# Patient Record
Sex: Female | Born: 1970
Health system: Southern US, Community
[De-identification: ages and names within clinical notes are randomized; demographics above are authoritative.]

## PROBLEM LIST (undated history)

## (undated) DIAGNOSIS — F329 Major depressive disorder, single episode, unspecified: Secondary | ICD-10-CM

## (undated) DIAGNOSIS — K219 Gastro-esophageal reflux disease without esophagitis: Secondary | ICD-10-CM

## (undated) DIAGNOSIS — Z8619 Personal history of other infectious and parasitic diseases: Secondary | ICD-10-CM

## (undated) DIAGNOSIS — G43909 Migraine, unspecified, not intractable, without status migrainosus: Secondary | ICD-10-CM

## (undated) DIAGNOSIS — K602 Anal fissure, unspecified: Secondary | ICD-10-CM

## (undated) DIAGNOSIS — F419 Anxiety disorder, unspecified: Secondary | ICD-10-CM

## (undated) DIAGNOSIS — E039 Hypothyroidism, unspecified: Secondary | ICD-10-CM

## (undated) DIAGNOSIS — F32A Depression, unspecified: Secondary | ICD-10-CM

## (undated) DIAGNOSIS — N390 Urinary tract infection, site not specified: Secondary | ICD-10-CM

## (undated) DIAGNOSIS — F319 Bipolar disorder, unspecified: Secondary | ICD-10-CM

## (undated) HISTORY — DX: Anxiety disorder, unspecified: F41.9

## (undated) HISTORY — DX: Personal history of other infectious and parasitic diseases: Z86.19

## (undated) HISTORY — DX: Major depressive disorder, single episode, unspecified: F32.9

## (undated) HISTORY — DX: Anal fissure, unspecified: K60.2

## (undated) HISTORY — DX: Gastro-esophageal reflux disease without esophagitis: K21.9

## (undated) HISTORY — DX: Depression, unspecified: F32.A

## (undated) HISTORY — DX: Migraine, unspecified, not intractable, without status migrainosus: G43.909

## (undated) HISTORY — DX: Hypothyroidism, unspecified: E03.9

## (undated) HISTORY — DX: Urinary tract infection, site not specified: N39.0

## (undated) HISTORY — DX: Bipolar disorder, unspecified: F31.9

---

## 1998-07-27 ENCOUNTER — Other Ambulatory Visit: Admission: RE | Admit: 1998-07-27 | Discharge: 1998-07-27 | Payer: Self-pay | Admitting: Obstetrics and Gynecology

## 2003-12-30 ENCOUNTER — Other Ambulatory Visit: Payer: Self-pay

## 2004-03-19 ENCOUNTER — Emergency Department: Payer: Self-pay | Admitting: General Practice

## 2004-12-27 ENCOUNTER — Ambulatory Visit: Payer: Self-pay | Admitting: Internal Medicine

## 2005-02-16 ENCOUNTER — Ambulatory Visit: Payer: Self-pay | Admitting: Internal Medicine

## 2005-04-03 ENCOUNTER — Ambulatory Visit: Payer: Self-pay | Admitting: Internal Medicine

## 2005-04-07 ENCOUNTER — Emergency Department: Payer: Self-pay | Admitting: Emergency Medicine

## 2005-04-09 ENCOUNTER — Encounter (INDEPENDENT_AMBULATORY_CARE_PROVIDER_SITE_OTHER): Payer: Self-pay | Admitting: Specialist

## 2005-04-09 ENCOUNTER — Ambulatory Visit: Payer: Self-pay | Admitting: Gastroenterology

## 2005-05-06 HISTORY — PX: TUBAL LIGATION: SHX77

## 2005-05-21 ENCOUNTER — Ambulatory Visit: Payer: Self-pay | Admitting: Gastroenterology

## 2005-07-24 ENCOUNTER — Ambulatory Visit: Payer: Self-pay | Admitting: *Deleted

## 2006-03-14 ENCOUNTER — Ambulatory Visit: Payer: Self-pay | Admitting: Obstetrics and Gynecology

## 2007-02-26 ENCOUNTER — Emergency Department: Payer: Self-pay | Admitting: Emergency Medicine

## 2008-05-06 HISTORY — PX: CHOLECYSTECTOMY: SHX55

## 2008-05-06 LAB — HM COLONOSCOPY: HM Colonoscopy: NORMAL

## 2008-08-29 ENCOUNTER — Ambulatory Visit (HOSPITAL_COMMUNITY): Admission: RE | Admit: 2008-08-29 | Discharge: 2008-08-29 | Payer: Self-pay | Admitting: Gastroenterology

## 2008-10-12 ENCOUNTER — Ambulatory Visit: Payer: Self-pay | Admitting: General Surgery

## 2009-05-06 HISTORY — PX: OTHER SURGICAL HISTORY: SHX169

## 2009-10-06 ENCOUNTER — Ambulatory Visit: Payer: Self-pay | Admitting: Obstetrics and Gynecology

## 2010-06-11 IMAGING — MG MAM DGTL SCREENING MAMMO W/CAD
1 series · 4 of 4 positions shown · non-contrast
Comparison: none

REASON FOR EXAM: BASELINE
COMMENTS:

PROCEDURE:     MAM - MAM DGTL SCREENING MAMMO W/CAD  - October 06, 2009  [DATE]
RESULT:       This is a baseline study.
The breasts exhibit a symmetric moderately dense parenchymal pattern. There
is no dominant mass. There are benign-appearing lymph nodes in the axillary
regions.

[Series 522: R CC · right · 4 of 4 slices shown]
[im 1/4]
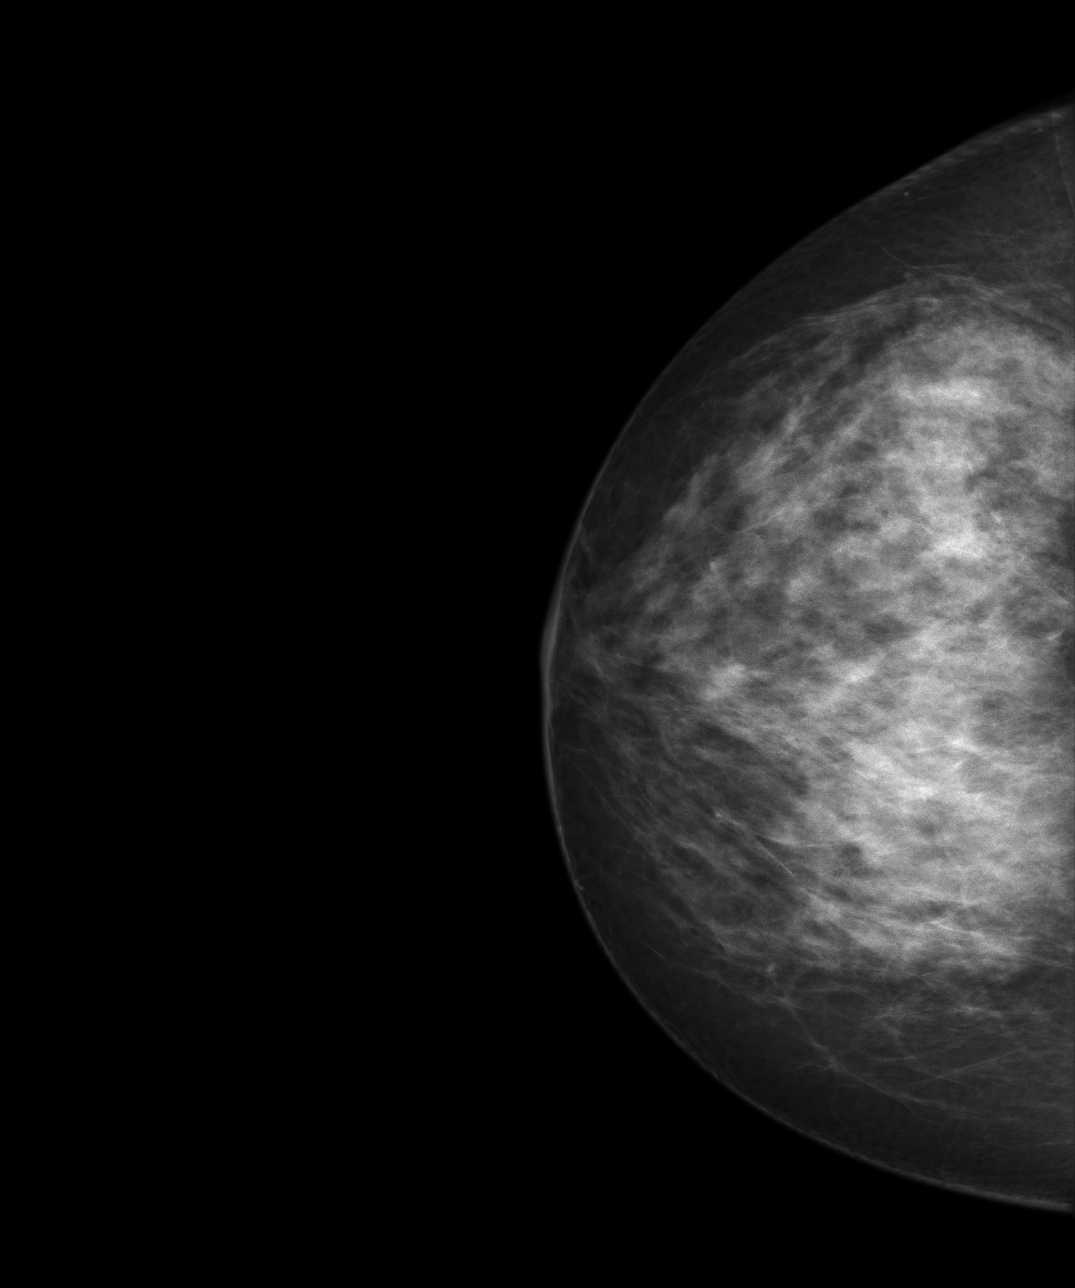
[im 2/4]
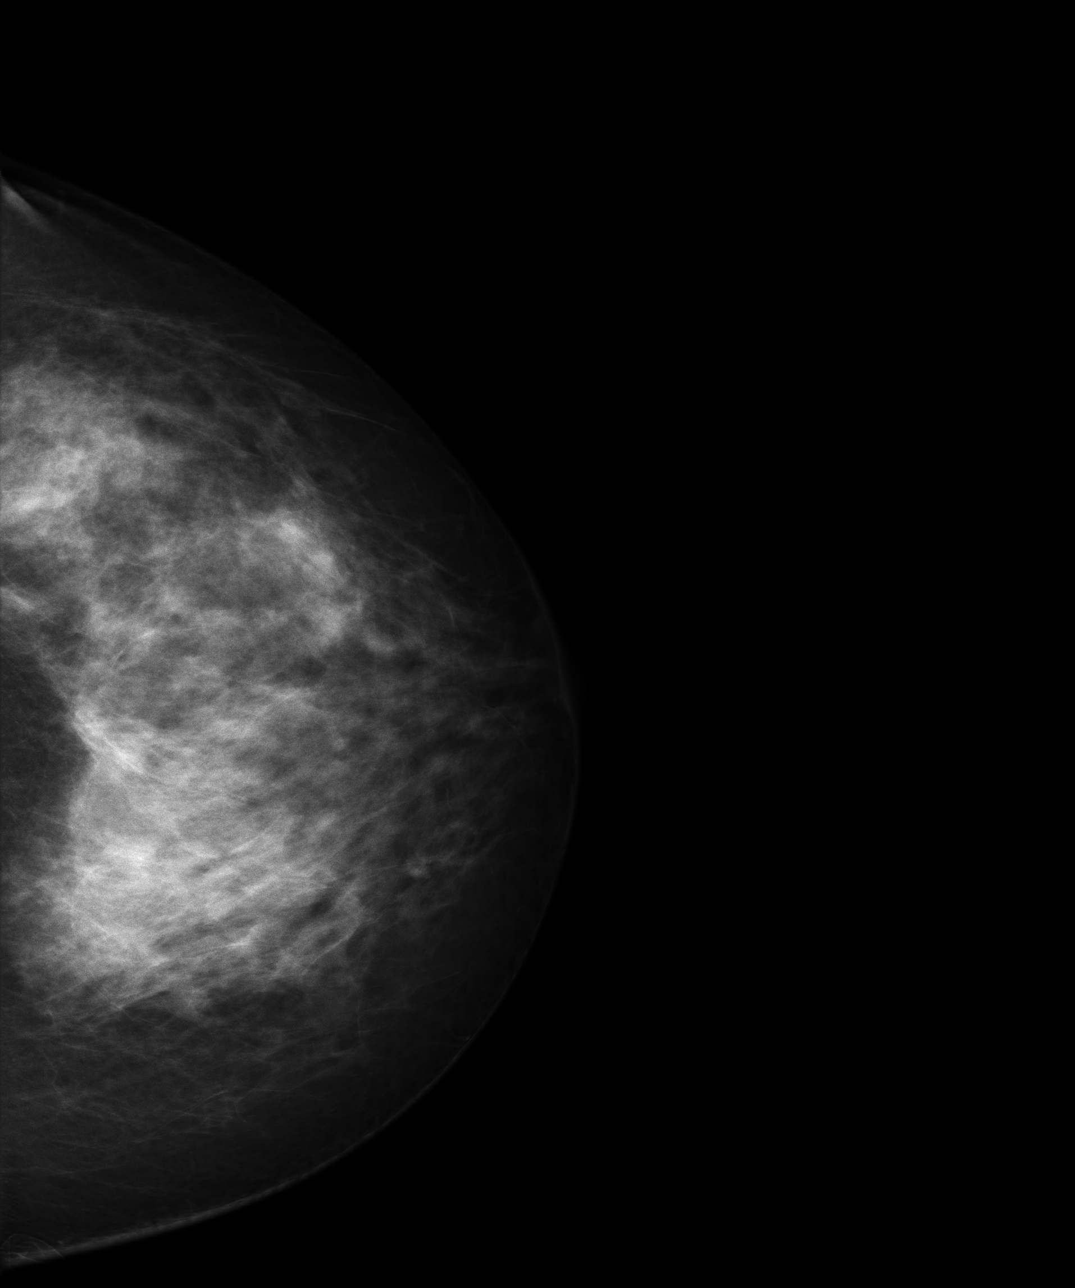
[im 3/4]
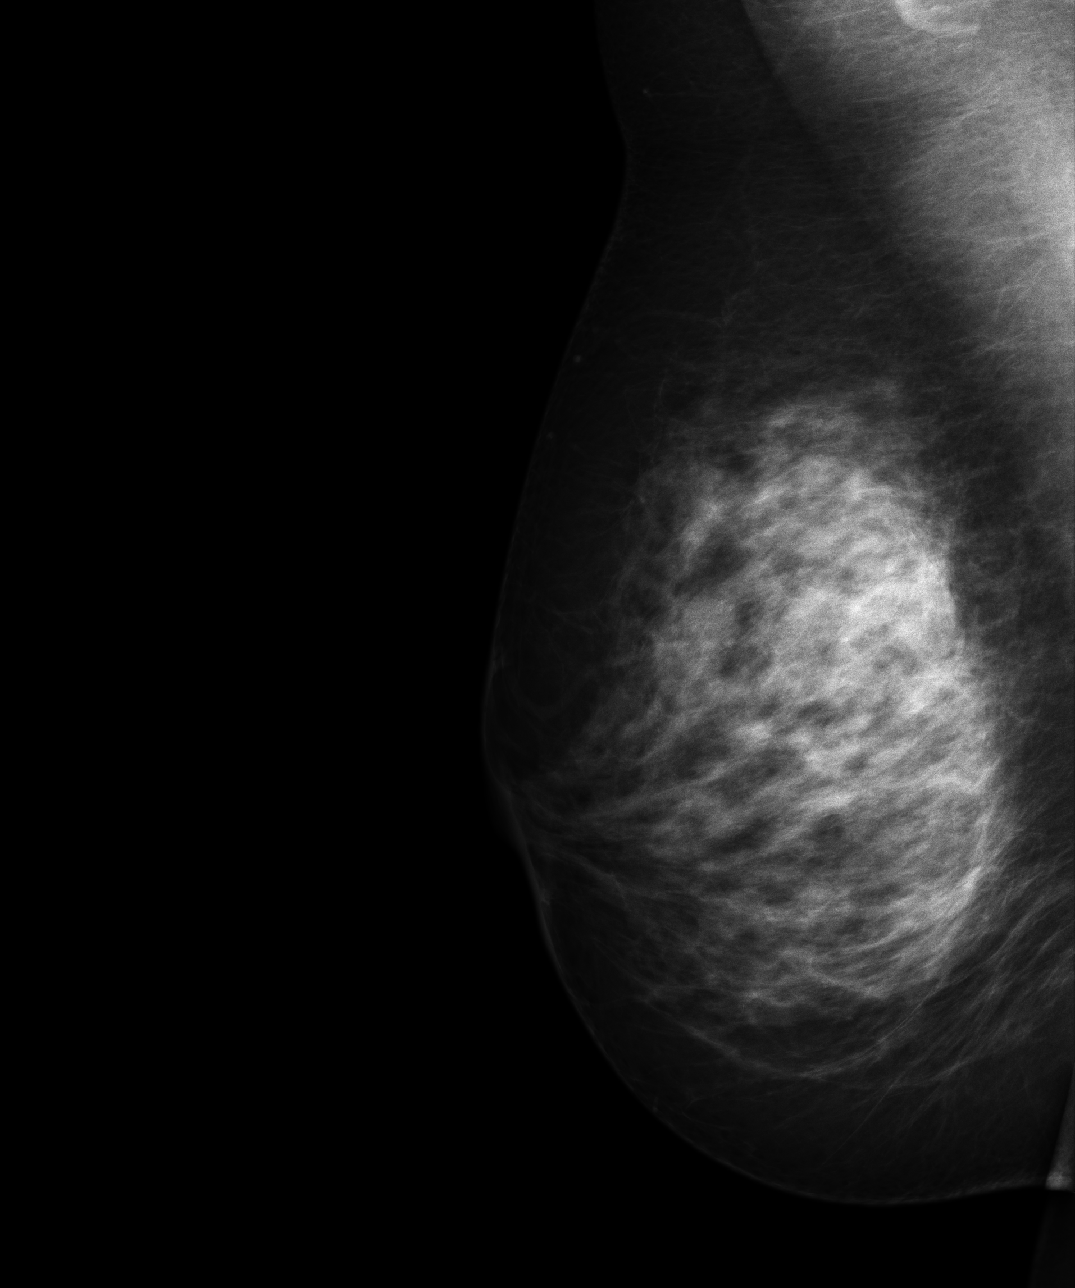
[im 4/4]
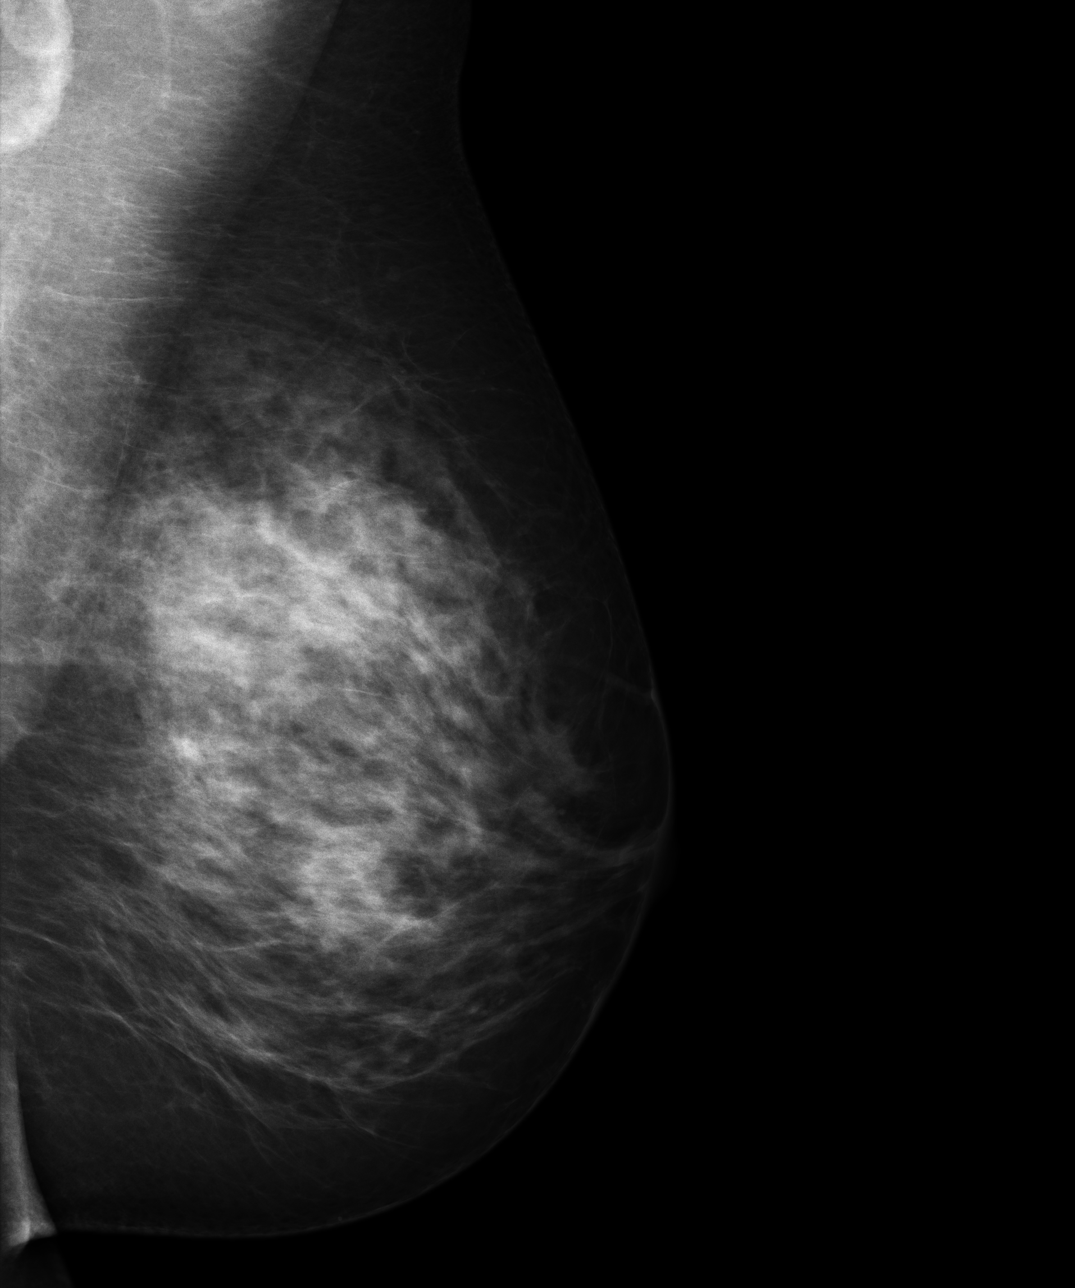

[4 of 4 positions shown; findings below may reference images not displayed]

IMPRESSION: 1.      I do not see findings suspicious for malignancy.
2.     BI-RADS:  Category 2- Benign Finding.

RECOMMENDATION:  Please begin yearly mammographic follow up.

A negative mammogram report does not preclude biopsy or other evaluation of
a clinically palpable or otherwise suspicious mass or lesion. Breast cancer
may not be detected by mammography in up to 10% of cases.

## 2011-02-13 ENCOUNTER — Ambulatory Visit: Payer: Self-pay | Admitting: Family Medicine

## 2011-09-11 ENCOUNTER — Other Ambulatory Visit: Payer: Self-pay | Admitting: Family Medicine

## 2011-09-11 LAB — LITHIUM LEVEL: Lithium: 0.82 mmol/L

## 2011-09-12 ENCOUNTER — Emergency Department: Payer: Self-pay | Admitting: Emergency Medicine

## 2011-09-12 LAB — COMPREHENSIVE METABOLIC PANEL
Albumin: 3.6 g/dL (ref 3.4–5.0)
Anion Gap: 3 — ABNORMAL LOW (ref 7–16)
BUN: 12 mg/dL (ref 7–18)
Bilirubin,Total: 0.9 mg/dL (ref 0.2–1.0)
Calcium, Total: 8.6 mg/dL (ref 8.5–10.1)
Co2: 26 mmol/L (ref 21–32)
EGFR (African American): >60
EGFR (Non-African Amer.): >60
Glucose: 103 mg/dL — ABNORMAL HIGH (ref 65–99)
Osmolality: 278 (ref 275–301)
Potassium: 4.4 mmol/L (ref 3.5–5.1)

## 2011-09-12 LAB — CBC
HCT: 40.2 % (ref 35.0–47.0)
MCHC: 33.1 g/dL (ref 32.0–36.0)
RBC: 4.54 10*6/uL (ref 3.80–5.20)
RDW: 13.8 % (ref 11.5–14.5)

## 2011-09-12 LAB — TROPONIN I: Troponin-I: <0.02 ng/mL

## 2011-09-12 LAB — CK TOTAL AND CKMB (NOT AT ARMC): CK-MB: <0.5 ng/mL — ABNORMAL LOW (ref 0.5–3.6)

## 2011-09-23 ENCOUNTER — Other Ambulatory Visit: Payer: Self-pay | Admitting: Family Medicine

## 2012-05-06 HISTORY — PX: ABDOMINAL HYSTERECTOMY: SHX81

## 2012-07-15 ENCOUNTER — Ambulatory Visit: Payer: Self-pay | Admitting: Obstetrics and Gynecology

## 2012-07-15 LAB — CBC
HCT: 41.4 % (ref 35.0–47.0)
HGB: 13.4 g/dL (ref 12.0–16.0)
MCHC: 32.3 g/dL (ref 32.0–36.0)
WBC: 8.8 10*3/uL (ref 3.6–11.0)

## 2012-07-15 LAB — BASIC METABOLIC PANEL
Calcium, Total: 8.6 mg/dL (ref 8.5–10.1)
Chloride: 105 mmol/L (ref 98–107)
EGFR (African American): >60
EGFR (Non-African Amer.): >60
Glucose: 85 mg/dL (ref 65–99)
Osmolality: 278 (ref 275–301)
Sodium: 139 mmol/L (ref 136–145)

## 2012-07-23 ENCOUNTER — Ambulatory Visit: Payer: Self-pay | Admitting: Obstetrics and Gynecology

## 2012-07-24 LAB — BASIC METABOLIC PANEL
Anion Gap: 6 — ABNORMAL LOW (ref 7–16)
Calcium, Total: 8 mg/dL — ABNORMAL LOW (ref 8.5–10.1)
Chloride: 109 mmol/L — ABNORMAL HIGH (ref 98–107)
Co2: 25 mmol/L (ref 21–32)
EGFR (African American): >60
EGFR (Non-African Amer.): 59 — ABNORMAL LOW
Glucose: 121 mg/dL — ABNORMAL HIGH (ref 65–99)
Potassium: 3.9 mmol/L (ref 3.5–5.1)
Sodium: 140 mmol/L (ref 136–145)

## 2012-07-24 LAB — CBC
MCH: 26.4 pg (ref 26.0–34.0)
MCHC: 33.5 g/dL (ref 32.0–36.0)
MCV: 79 fL — ABNORMAL LOW (ref 80–100)
RBC: 4.32 10*6/uL (ref 3.80–5.20)
RDW: 16.7 % — ABNORMAL HIGH (ref 11.5–14.5)
WBC: 12 10*3/uL — ABNORMAL HIGH (ref 3.6–11.0)

## 2014-03-11 LAB — HM MAMMOGRAPHY: HM Mammogram: NORMAL

## 2014-03-11 LAB — HM PAP SMEAR: HM PAP: NORMAL

## 2014-03-18 LAB — LIPID PANEL
Cholesterol: 152 mg/dL (ref 0–200)
HDL: 40 mg/dL (ref 35–70)
LDL CALC: 97 mg/dL
Triglycerides: 76 mg/dL (ref 40–160)

## 2014-03-18 LAB — BASIC METABOLIC PANEL
BUN: 14 mg/dL (ref 4–21)
Creatinine: 1 mg/dL (ref 0.5–1.1)
Glucose: 83 mg/dL
Potassium: 4.5 mmol/L (ref 3.4–5.3)
Sodium: 140 mmol/L (ref 137–147)

## 2014-03-18 LAB — CBC AND DIFFERENTIAL
HEMATOCRIT: 43 % (ref 36–46)
Hemoglobin: 14.7 g/dL (ref 12.0–16.0)
NEUTROS ABS: 4 /uL
Platelets: 197 10*3/uL (ref 150–399)
WBC: 7.1 10^3/mL

## 2014-03-18 LAB — HEPATIC FUNCTION PANEL
ALT: 12 U/L (ref 7–35)
AST: 10 U/L — AB (ref 13–35)
Alkaline Phosphatase: 77 U/L (ref 25–125)
Bilirubin, Total: 1 mg/dL

## 2014-03-18 LAB — TSH: TSH: 3.03 u[IU]/mL (ref 0.41–5.90)

## 2014-08-26 NOTE — Op Note (Signed)
PATIENT NAME:  Sandra Bass, Sandra Bass MR#:  [id number] DATE OF BIRTH:  [birth year]  DATE OF PROCEDURE:  07/23/2012  PREOPERATIVE DIAGNOSIS: Heavy menstrual bleeding with fibroids unresponsive to medical management.   POSTOPERATIVE DIAGNOSIS: Heavy menstrual bleeding with fibroids unresponsive to medical management.   PROCEDURE PERFORMED: Laparoscopic supracervical hysterectomy, bilateral salpingectomy.   SURGEON:  Erik Obey, MD  ESTIMATED BLOOD LOSS: 100 mL.   FINDINGS: Approximately 12 week size uterus with angry-appearing peritoneum.  DESCRIPTION OF PROCEDURE: The patient was taken to the operating room and placed in the supine position.  After adequate general endotracheal anesthesia was instilled, the patient was prepped and draped in the usual sterile fashion. A side-opening speculum was placed in the patient's vagina. The anterior lip of the cervix was grasped with a single-tooth tenaculum. The uterus was sounded and a Hulka tenaculum was placed. The side-opening speculum was removed. The umbilicus was injected with Marcaine. An incision was made. Veress needle was placed. Hanging drop test, fluid instillation test and fluid aspiration test showed proper placement of the Veress needle. CO2 was placed on low flow. When tympany was heard around the liver, CO2 was placed on high flow. A 7 mm trocar was placed on the left lower quadrant and a 5 mm trocar was placed on the right lower quadrant. Harmonic scalpel was then used to remove the bilateral tubes and then cut across the utero-ovarian ligaments bilaterally with serial bites taken down to the uterine artery. The uterine arteries were grasped and ligated. A bladder flap was created. Harmonic scalpel was then used to cut across the cervical uterine isthmus.  Good hemostasis was identified. The morcellator was then placed into the abdomen and the uterus was morcellated without splattering of the tissue. The pelvis was copiously irrigated with warm  normal saline and good hemostasis was identified. Interceed was placed across the cervical stump and the fluid was removed. The patient was taken out of Trendelenburg, laid supine, with clear blue urine noted in the Foley bag as indigo carmine had been given after ligation of the uterine arteries. The patient was laid supine and taken to recovery after having tolerated the procedure well.  ____________________________ Delsa Sale, MD cck:sb D: 08/03/2012 23:38:00 ET T: 08/04/2012 07:55:16 ET JOB#: [id number]  cc: Delsa Sale, MD, <Dictator> Delsa Sale MD ELECTRONICALLY SIGNED 08/05/2012 22:54

## 2014-09-23 ENCOUNTER — Ambulatory Visit (INDEPENDENT_AMBULATORY_CARE_PROVIDER_SITE_OTHER): Payer: BLUE CROSS/BLUE SHIELD | Admitting: Nurse Practitioner

## 2014-09-23 ENCOUNTER — Encounter (INDEPENDENT_AMBULATORY_CARE_PROVIDER_SITE_OTHER): Payer: Self-pay

## 2014-09-23 ENCOUNTER — Encounter: Payer: Self-pay | Admitting: Nurse Practitioner

## 2014-09-23 VITALS — BP 102/68 | HR 78 | Temp 98.2°F | Resp 12 | Ht 64.0 in | Wt 195.8 lb

## 2014-09-23 DIAGNOSIS — Z7189 Other specified counseling: Secondary | ICD-10-CM

## 2014-09-23 DIAGNOSIS — Z7689 Persons encountering health services in other specified circumstances: Secondary | ICD-10-CM | POA: Insufficient documentation

## 2014-09-23 DIAGNOSIS — K219 Gastro-esophageal reflux disease without esophagitis: Secondary | ICD-10-CM | POA: Insufficient documentation

## 2014-09-23 DIAGNOSIS — F319 Bipolar disorder, unspecified: Secondary | ICD-10-CM | POA: Diagnosis not present

## 2014-09-23 DIAGNOSIS — G43009 Migraine without aura, not intractable, without status migrainosus: Secondary | ICD-10-CM

## 2014-09-23 DIAGNOSIS — N644 Mastodynia: Secondary | ICD-10-CM

## 2014-09-23 MED ORDER — NITROFURANTOIN MACROCRYSTAL 100 MG PO CAPS: 100.0000 mg | ORAL_CAPSULE | Freq: Four times a day (QID) | ORAL | Status: DC

## 2014-09-23 NOTE — Assessment & Plan Note (Signed)
Followed by Dr. Nicolasa Ducking. Currently on Abilify, Lamictal, Klonopin, and trazodone.

## 2014-09-23 NOTE — Progress Notes (Signed)
Pre visit review using our clinic review tool, if applicable. No additional management support is needed unless otherwise documented below in the visit note. 

## 2014-09-23 NOTE — Progress Notes (Signed)
Subjective:    Patient ID: Sandra Bass, female    DOB: [birth year], 44 y.o.   MRN: [id number]  HPI   Sandra Bass is a 44 yo female establishing care and CC of right breast pain.   1) New pt info:   Immunizations- Unsure about tdap  Mammogram- 2015 normal per pt  Pap- 2015 normal per pt [facility])   Colonoscopy- 2010 normal   Eye Exam- Feb. 2016   Dental Exam- UTD  2) Chronic Problems-  Bipolar disorder and seeing Dr. Nicolasa Ducking   GERD- Stable on Protonix  Migraines- 2 a week, frequently, imitrex when needed   3) Acute Problems-  Right breast name- under nipple, achy, comes and goes. X 4 months   Review of Systems  Constitutional: Positive for fatigue. Negative for fever, chills and diaphoresis.  HENT: Negative for tinnitus and trouble swallowing.   Eyes: Negative for visual disturbance.  Respiratory: Negative for chest tightness, shortness of breath and wheezing.   Cardiovascular: Negative for chest pain, palpitations and leg swelling.  Gastrointestinal: Negative for nausea, vomiting, diarrhea and constipation.  Genitourinary: Negative for difficulty urinating.  Musculoskeletal: Negative for back pain and neck pain.  Skin: Positive for rash.       Dr. Nicole Kindred for rash on top of head  Neurological: Positive for headaches. Negative for dizziness, weakness and numbness.       Migraines  Hematological: Does not bruise/bleed easily.  Psychiatric/Behavioral: Negative for suicidal ideas and sleep disturbance. The patient is nervous/anxious.    Past Medical History  Diagnosis Date  . History of chicken pox   . Depression     BIPOLAR  . GERD (gastroesophageal reflux disease)   . Migraine   . UTI (lower urinary tract infection)     History   Social History  . Marital Status: Married    Spouse Name: N/A  . Number of Children: N/A  . Years of Education: N/A   Occupational History  . Not on file.   Social History Main Topics  . Smoking status: Never Smoker   . Smokeless  tobacco: Never Used  . Alcohol Use: No  . Drug Use: No  . Sexual Activity:    Partners: Male     Comment: Husband   Other Topics Concern  . Not on file   Social History Narrative   Works at [employer], [employer] in [place]   Lives with husband    Children- none   Pets: None    Caffeine- None, rare hot chocolate    Right handed    Enjoys baseball, taking an art class    Past Surgical History  Procedure Laterality Date  . Cholecystectomy  2010  . Abdominal hysterectomy  2014  . Tubal ligation  2007  .  cyst  2011    Benign cyst removed R great toe    Family History  Problem Relation Age of Onset  . Arthritis Mother   . Glaucoma Father   . Stroke Maternal Grandmother   . Stroke Maternal Grandfather   . Arthritis Paternal Grandmother     lung  . Arthritis Paternal Grandfather     lung  . Heart disease Paternal Grandfather   . Glaucoma Paternal Grandfather     Allergies  Allergen Reactions  . Aspirin Other (See Comments)    Nose bleeds  . Hydrocodone Itching    Clawing of the skin    No current outpatient prescriptions on file prior to visit.   No current  facility-administered medications on file prior to visit.      Objective:   Physical Exam  Constitutional: She is oriented to person, place, and time. She appears well-developed and well-nourished. No distress.  BP 102/68 mmHg  Pulse 78  Temp(Src) 98.2 F (36.8 C) (Oral)  Resp 12  Ht 5\' 4"  (1.626 m)  Wt 195 lb 12.8 oz (88.814 kg)  BMI 33.59 kg/m2  SpO2 98%   HENT:  Head: Normocephalic and atraumatic.  Right Ear: External ear normal.  Left Ear: External ear normal.  Eyes: EOM are normal. Pupils are equal, round, and reactive to light. Right eye exhibits no discharge. Left eye exhibits no discharge. No scleral icterus.  Cardiovascular: Normal rate, regular rhythm and normal heart sounds.  Exam reveals no gallop and no friction rub.   No murmur heard. Pulmonary/Chest: Effort normal and breath sounds  normal. No respiratory distress. She has no wheezes. She has no rales. She exhibits no tenderness.  Breast exam was normal today, no tenderness to palpation, no discharge from nipples  Neurological: She is alert and oriented to person, place, and time. No cranial nerve deficit. She exhibits normal muscle tone. Coordination normal.  Skin: Skin is warm and dry. No rash noted. She is not diaphoretic.  Psychiatric: She has a normal mood and affect. Her behavior is normal. Judgment and thought content normal.      Assessment & Plan:

## 2014-09-23 NOTE — Patient Instructions (Signed)
We will place the orders for breast exam at [facility]. We will contact you about these (may take a week or two).   Watch for increasing pain/swelling/ or discharge from nipples.   Do not add any caffeine or vitamins without consulting [place].

## 2014-09-23 NOTE — Assessment & Plan Note (Signed)
Discussed acute and chronic issues. Reviewed health maintenance measures, PFSHx, and immunizations. Obtain records from [facility].

## 2014-09-24 DIAGNOSIS — G43909 Migraine, unspecified, not intractable, without status migrainosus: Secondary | ICD-10-CM | POA: Insufficient documentation

## 2014-09-24 DIAGNOSIS — N644 Mastodynia: Secondary | ICD-10-CM | POA: Insufficient documentation

## 2014-09-24 NOTE — Assessment & Plan Note (Signed)
Saw for breast pain today. Location- behind nipple in areola, achy and x 4 months, breast exam normal, will send for ultrasound. Pt had Mammogram diagnostic in 2015/Nov she reports with no significant findings.

## 2014-09-24 NOTE — Assessment & Plan Note (Signed)
Stable. On imitrex currently. Pt reports they are at least twice a week, but this is not increased in frequency. Will continue to follow.

## 2014-09-24 NOTE — Assessment & Plan Note (Signed)
Stable on Protonix. Will continue regimen. Encouraged good diet of healthy non-acidic foods.

## 2014-10-11 ENCOUNTER — Other Ambulatory Visit: Payer: Self-pay | Admitting: Nurse Practitioner

## 2014-10-11 DIAGNOSIS — N644 Mastodynia: Secondary | ICD-10-CM

## 2014-10-19 ENCOUNTER — Ambulatory Visit: Payer: BLUE CROSS/BLUE SHIELD

## 2014-10-19 ENCOUNTER — Other Ambulatory Visit: Payer: BLUE CROSS/BLUE SHIELD

## 2014-10-27 ENCOUNTER — Other Ambulatory Visit: Payer: Self-pay | Admitting: Nurse Practitioner

## 2014-10-27 ENCOUNTER — Ambulatory Visit
Admission: RE | Admit: 2014-10-27 | Discharge: 2014-10-27 | Disposition: A | Discharge status: Home / Self Care | Payer: BLUE CROSS/BLUE SHIELD | Source: Ambulatory Visit | Attending: Nurse Practitioner | Admitting: Nurse Practitioner

## 2014-10-27 ENCOUNTER — Ambulatory Visit: Payer: BLUE CROSS/BLUE SHIELD

## 2014-10-27 DIAGNOSIS — N644 Mastodynia: Secondary | ICD-10-CM | POA: Diagnosis not present

## 2014-11-10 ENCOUNTER — Encounter: Payer: Self-pay | Admitting: Nurse Practitioner

## 2014-11-10 DIAGNOSIS — F4310 Post-traumatic stress disorder, unspecified: Secondary | ICD-10-CM | POA: Insufficient documentation

## 2014-11-11 LAB — LIPID PANEL
CHOLESTEROL: 169 mg/dL (ref 0–200)
HDL: 42 mg/dL (ref 35–70)
LDL CALC: 106 mg/dL
Triglycerides: 104 mg/dL (ref 40–160)

## 2014-11-11 LAB — TSH: TSH: 5.3 u[IU]/mL (ref 0.41–5.90)

## 2014-11-11 LAB — HEMOGLOBIN A1C: Hgb A1c MFr Bld: 5.3 % (ref 4.0–6.0)

## 2015-01-26 ENCOUNTER — Telehealth: Payer: Self-pay

## 2015-01-26 NOTE — Telephone Encounter (Signed)
LMTCB about labs

## 2015-01-26 NOTE — Telephone Encounter (Signed)
-----   Message from Rubbie Battiest, NP sent at 01/25/2015  2:51 PM EDT ----- Please let pt know that we received her labs from Dr. Nicolasa Ducking and her TSH was slightly elevated (thyroid). If she can make a lab appointment we can get thyroid labs to recheck and see if she needs to be adjusted in her levothyroxine. This was from July so I'm sure things have changed.   Thanks, Lorane Gell

## 2015-01-27 ENCOUNTER — Telehealth: Payer: Self-pay

## 2015-01-27 ENCOUNTER — Other Ambulatory Visit: Payer: Self-pay | Admitting: Nurse Practitioner

## 2015-01-27 DIAGNOSIS — E039 Hypothyroidism, unspecified: Secondary | ICD-10-CM

## 2015-01-27 NOTE — Telephone Encounter (Signed)
Completed.    Thanks

## 2015-01-27 NOTE — Telephone Encounter (Signed)
Informed pt of lab results, pt verbalized understanding and is scheduled for f/u lab for her TSH on 9.30.16 @ 2:00

## 2015-01-27 NOTE — Telephone Encounter (Signed)
Informed pt of lab results, pt verbalized understanding and is scheduled for a lab appt on 9.30.16 @ 2:00

## 2015-02-03 ENCOUNTER — Other Ambulatory Visit: Payer: BLUE CROSS/BLUE SHIELD

## 2015-02-03 DIAGNOSIS — E039 Hypothyroidism, unspecified: Secondary | ICD-10-CM

## 2015-02-03 LAB — T4, FREE: FREE T4: 1.36 ng/dL (ref 0.80–1.80)

## 2015-02-03 LAB — TSH: TSH: 0.873 u[IU]/mL (ref 0.350–4.500)

## 2015-02-13 ENCOUNTER — Other Ambulatory Visit: Payer: Self-pay | Admitting: Nurse Practitioner

## 2015-02-13 ENCOUNTER — Other Ambulatory Visit: Payer: Self-pay

## 2015-02-13 MED ORDER — LEVOTHYROXINE SODIUM 150 MCG PO TABS: 150.0000 ug | ORAL_TABLET | Freq: Every day | ORAL | Status: DC

## 2015-02-13 NOTE — Telephone Encounter (Signed)
Patient called the triage line, requesting thyroid labs.  Called patient back, reviewed labs that were normal and mailed to her.  Patient is requesting a new prescription for her Thyroid medication.  Please advise?

## 2015-03-27 ENCOUNTER — Ambulatory Visit (INDEPENDENT_AMBULATORY_CARE_PROVIDER_SITE_OTHER): Payer: BLUE CROSS/BLUE SHIELD | Admitting: Nurse Practitioner

## 2015-03-27 ENCOUNTER — Encounter: Payer: Self-pay | Admitting: Nurse Practitioner

## 2015-03-27 ENCOUNTER — Other Ambulatory Visit (HOSPITAL_COMMUNITY)
Admission: RE | Admit: 2015-03-27 | Discharge: 2015-03-27 | Disposition: A | Discharge status: Home / Self Care | Payer: BLUE CROSS/BLUE SHIELD | Source: Ambulatory Visit | Attending: Nurse Practitioner | Admitting: Nurse Practitioner

## 2015-03-27 VITALS — BP 106/70 | HR 88 | Temp 98.3°F | Ht 65.0 in | Wt 206.0 lb

## 2015-03-27 DIAGNOSIS — Z01419 Encounter for gynecological examination (general) (routine) without abnormal findings: Secondary | ICD-10-CM | POA: Diagnosis not present

## 2015-03-27 DIAGNOSIS — Z23 Encounter for immunization: Secondary | ICD-10-CM

## 2015-03-27 DIAGNOSIS — Z0000 Encounter for general adult medical examination without abnormal findings: Secondary | ICD-10-CM | POA: Insufficient documentation

## 2015-03-27 DIAGNOSIS — Z1151 Encounter for screening for human papillomavirus (HPV): Secondary | ICD-10-CM | POA: Diagnosis not present

## 2015-03-27 DIAGNOSIS — Z1239 Encounter for other screening for malignant neoplasm of breast: Secondary | ICD-10-CM | POA: Diagnosis not present

## 2015-03-27 MED ORDER — PANTOPRAZOLE SODIUM 40 MG PO TBEC: 40.0000 mg | DELAYED_RELEASE_TABLET | Freq: Every day | ORAL | Status: DC

## 2015-03-27 MED ORDER — NITROFURANTOIN MACROCRYSTAL 100 MG PO CAPS: 100.0000 mg | ORAL_CAPSULE | Freq: Four times a day (QID) | ORAL | Status: DC

## 2015-03-27 MED ORDER — SUMATRIPTAN SUCCINATE 100 MG PO TABS: 100.0000 mg | ORAL_TABLET | ORAL | Status: DC | PRN

## 2015-03-27 NOTE — Addendum Note (Signed)
Addended byCloyd Stagers B on: 03/27/2015 03:12 PM   Modules accepted: Orders, SmartSet

## 2015-03-27 NOTE — Patient Instructions (Addendum)
[facility]  Address: [address], [place], [place] [address]  Phone: [phone] Hours:  Monday - Thursday: 8 a.m. - 5 p.m. Friday: 8 a.m. - 3 p.m.     Health Maintenance, Female Adopting a healthy lifestyle and getting preventive care can go a long way to promote health and wellness. Talk with your health care provider about what schedule of regular examinations is right for you. This is a good chance for you to check in with your provider about disease prevention and staying healthy. In between checkups, there are plenty of things you can do on your own. Experts have done a lot of research about which lifestyle changes and preventive measures are most likely to keep you healthy. Ask your health care provider for more information. WEIGHT AND DIET  Eat a healthy diet  Be sure to include plenty of vegetables, fruits, low-fat dairy products, and lean protein.  Do not eat a lot of foods high in solid fats, added sugars, or salt.  Get regular exercise. This is one of the most important things you can do for your health.  Most adults should exercise for at least 150 minutes each week. The exercise should increase your heart rate and make you sweat (moderate-intensity exercise).  Most adults should also do strengthening exercises at least twice a week. This is in addition to the moderate-intensity exercise.  Maintain a healthy weight  Body mass index (BMI) is a measurement that can be used to identify possible weight problems. It estimates body fat based on height and weight. Your health care provider can help determine your BMI and help you achieve or maintain a healthy weight.  For females 20 years of age and older:   A BMI below 18.5 is considered underweight.  A BMI of 18.5 to 24.9 is normal.  A BMI of 25 to 29.9 is considered overweight.  A BMI of 30 and above is considered obese.  Watch levels of cholesterol and blood lipids  You  should start having your blood tested for lipids and cholesterol at 45 years of age, then have this test every 5 years.  You may need to have your cholesterol levels checked more often if:  Your lipid or cholesterol levels are high.  You are older than 45 years of age.  You are at high risk for heart disease.  CANCER SCREENING   Lung Cancer  Lung cancer screening is recommended for adults 55-80 years old who are at high risk for lung cancer because of a history of smoking.  A yearly low-dose CT scan of the lungs is recommended for people who:  Currently smoke.  Have quit within the past 15 years.  Have at least a 30-pack-year history of smoking. A pack year is smoking an average of one pack of cigarettes a day for 1 year.  Yearly screening should continue until it has been 15 years since you quit.  Yearly screening should stop if you develop a health problem that would prevent you from having lung cancer treatment.  Breast Cancer  Practice breast self-awareness. This means understanding how your breasts normally appear and feel.  It also means doing regular breast self-exams. Let your health care provider know about any changes, no matter how small.  If you are in your 20s or 30s, you should have a clinical breast exam (CBE) by a health care provider every 1-3 years as part of a regular health exam.  If you are   have a CBE every year. Also consider having a breast X-ray (mammogram) every year.  If you have a family history of breast cancer, talk to your health care provider about genetic screening.  If you are at high risk for breast cancer, talk to your health care provider about having an MRI and a mammogram every year.  Breast cancer gene (BRCA) assessment is recommended for women who have family members with BRCA-related cancers. BRCA-related cancers include:  Breast.  Ovarian.  Tubal.  Peritoneal cancers.  Results of the assessment will determine the  need for genetic counseling and BRCA1 and BRCA2 testing. Cervical Cancer Your health care provider may recommend that you be screened regularly for cancer of the pelvic organs (ovaries, uterus, and vagina). This screening involves a pelvic examination, including checking for microscopic changes to the surface of your cervix (Pap test). You may be encouraged to have this screening done every 3 years, beginning at age 34.  For women ages 68-65, health care providers may recommend pelvic exams and Pap testing every 3 years, or they may recommend the Pap and pelvic exam, combined with testing for human papilloma virus (HPV), every 5 years. Some types of HPV increase your risk of cervical cancer. Testing for HPV may also be done on women of any age with unclear Pap test results.  Other health care providers may not recommend any screening for nonpregnant women who are considered low risk for pelvic cancer and who do not have symptoms. Ask your health care provider if a screening pelvic exam is right for you.  If you have had past treatment for cervical cancer or a condition that could lead to cancer, you need Pap tests and screening for cancer for at least 20 years after your treatment. If Pap tests have been discontinued, your risk factors (such as having a new sexual partner) need to be reassessed to determine if screening should resume. Some women have medical problems that increase the chance of getting cervical cancer. In these cases, your health care provider may recommend more frequent screening and Pap tests. Colorectal Cancer  This type of cancer can be detected and often prevented.  Routine colorectal cancer screening usually begins at 45 years of age and continues through 45 years of age.  Your health care provider may recommend screening at an earlier age if you have risk factors for colon cancer.  Your health care provider may also recommend using home test kits to check for hidden blood in  the stool.  A small camera at the end of a tube can be used to examine your colon directly (sigmoidoscopy or colonoscopy). This is done to check for the earliest forms of colorectal cancer.  Routine screening usually begins at age 46.  Direct examination of the colon should be repeated every 5-10 years through 45 years of age. However, you may need to be screened more often if early forms of precancerous polyps or small growths are found. Skin Cancer  Check your skin from head to toe regularly.  Tell your health care provider about any new moles or changes in moles, especially if there is a change in a mole's shape or color.  Also tell your health care provider if you have a mole that is larger than the size of a pencil eraser.  Always use sunscreen. Apply sunscreen liberally and repeatedly throughout the day.  Protect yourself by wearing long sleeves, pants, a wide-brimmed hat, and sunglasses whenever you are outside. HEART DISEASE, DIABETES, AND  HIGH BLOOD PRESSURE   High blood pressure causes heart disease and increases the risk of stroke. High blood pressure is more likely to develop in:  People who have blood pressure in the high end of the normal range (130-139/85-89 mm Hg).  People who are overweight or obese.  People who are African American.  If you are 82-58 years of age, have your blood pressure checked every 3-5 years. If you are 8 years of age or older, have your blood pressure checked every year. You should have your blood pressure measured twice--once when you are at a hospital or clinic, and once when you are not at a hospital or clinic. Record the average of the two measurements. To check your blood pressure when you are not at a hospital or clinic, you can use:  An automated blood pressure machine at a pharmacy.  A home blood pressure monitor.  If you are between 75 years and 23 years old, ask your health care provider if you should take aspirin to prevent  strokes.  Have regular diabetes screenings. This involves taking a blood sample to check your fasting blood sugar level.  If you are at a normal weight and have a low risk for diabetes, have this test once every three years after 45 years of age.  If you are overweight and have a high risk for diabetes, consider being tested at a younger age or more often. PREVENTING INFECTION  Hepatitis B  If you have a higher risk for hepatitis B, you should be screened for this virus. You are considered at high risk for hepatitis B if:  You were born in a country where hepatitis B is common. Ask your health care provider which countries are considered high risk.  Your parents were born in a high-risk country, and you have not been immunized against hepatitis B (hepatitis B vaccine).  You have HIV or AIDS.  You use needles to inject street drugs.  You live with someone who has hepatitis B.  You have had sex with someone who has hepatitis B.  You get hemodialysis treatment.  You take certain medicines for conditions, including cancer, organ transplantation, and autoimmune conditions. Hepatitis C  Blood testing is recommended for:  Everyone born from [birth year] through 1966.  Anyone with known risk factors for hepatitis C. Sexually transmitted infections (STIs)  You should be screened for sexually transmitted infections (STIs) including gonorrhea and chlamydia if:  You are sexually active and are younger than 45 years of age.  You are older than 45 years of age and your health care provider tells you that you are at risk for this type of infection.  Your sexual activity has changed since you were last screened and you are at an increased risk for chlamydia or gonorrhea. Ask your health care provider if you are at risk.  If you do not have HIV, but are at risk, it may be recommended that you take a prescription medicine daily to prevent HIV infection. This is called pre-exposure prophylaxis  (PrEP). You are considered at risk if:  You are sexually active and do not regularly use condoms or know the HIV status of your partner(s).  You take drugs by injection.  You are sexually active with a partner who has HIV. Talk with your health care provider about whether you are at high risk of being infected with HIV. If you choose to begin PrEP, you should first be tested for HIV. You should then be tested  then be tested every 3 months for as long as you are taking PrEP.  PREGNANCY   If you are premenopausal and you may become pregnant, ask your health care provider about preconception counseling.  If you may become pregnant, take 400 to 800 micrograms (mcg) of folic acid every day.  If you want to prevent pregnancy, talk to your health care provider about birth control (contraception). OSTEOPOROSIS AND MENOPAUSE   Osteoporosis is a disease in which the bones lose minerals and strength with aging. This can result in serious bone fractures. Your risk for osteoporosis can be identified using a bone density scan.  If you are 65 years of age or older, or if you are at risk for osteoporosis and fractures, ask your health care provider if you should be screened.  Ask your health care provider whether you should take a calcium or vitamin D supplement to lower your risk for osteoporosis.  Menopause may have certain physical symptoms and risks.  Hormone replacement therapy may reduce some of these symptoms and risks. Talk to your health care provider about whether hormone replacement therapy is right for you.  HOME CARE INSTRUCTIONS   Schedule regular health, dental, and eye exams.  Stay current with your immunizations.   Do not use any tobacco products including cigarettes, chewing tobacco, or electronic cigarettes.  If you are pregnant, do not drink alcohol.  If you are breastfeeding, limit how much and how often you drink alcohol.  Limit alcohol intake to no more than 1 drink per day for  nonpregnant women. One drink equals 12 ounces of beer, 5 ounces of wine, or 1 ounces of hard liquor.  Do not use street drugs.  Do not share needles.  Ask your health care provider for help if you need support or information about quitting drugs.  Tell your health care provider if you often feel depressed.  Tell your health care provider if you have ever been abused or do not feel safe at home.   This information is not intended to replace advice given to you by your health care provider. Make sure you discuss any questions you have with your health care provider.   Document Released: 11/05/2010 Document Revised: 05/13/2014 Document Reviewed: 03/24/2013 Elsevier Interactive Patient Education 2016 [employer]  

## 2015-03-27 NOTE — Assessment & Plan Note (Addendum)
Discussed acute and chronic issues. Reviewed health maintenance measures, PFSHx, and immunizations. Obtain routine labs TSH, Lipid panel, CBC w/ diff, A1c, and CMET in the future.   Tdap updated today

## 2015-03-27 NOTE — Assessment & Plan Note (Signed)
PAP obtained today (vaginal sample since hysterectomy performed), pt had CC of vaginal odor so will ask for any interpretation. Clinical breast exam performed.   Mammogram ordered.

## 2015-03-27 NOTE — Progress Notes (Signed)
Patient ID: Sandra Bass, female    DOB: 1970/12/15  Age: 44 y.o. MRN: [id number]  CC: Annual Exam   HPI Sandra Bass presents for Annual Physical exam.   1) Health Maintenance-   Diet- No formal   Exercise- No formal   Immunizations- Tdap update today  Mammogram- Need scheduled  Pap- 2015, will get today per pt request even though has hysterectomy   Eye Exam- 9 months ago   Dental Exam- UTD  2) Chronic Problems-  Bipolar d/o-stable since last visit with Dr. Nicolasa Bass   History Sandra Bass has a past medical history of History of chicken pox; Depression; GERD (gastroesophageal reflux disease); Migraine; and UTI (lower urinary tract infection).   She has past surgical history that includes Cholecystectomy (2010); Abdominal hysterectomy (2014); Tubal ligation (2007); and  cyst (2011).   Her family history includes Arthritis in her mother, paternal grandfather, and paternal grandmother; Glaucoma in her father and paternal grandfather; Heart disease in her paternal grandfather; Stroke in her maternal grandfather and maternal grandmother.She reports that she has never smoked. She has never used smokeless tobacco. She reports that she does not drink alcohol or use illicit drugs.  Outpatient Prescriptions Prior to Visit  Medication Sig Dispense Refill  . acyclovir ointment (ZOVIRAX) 5 % Apply 1 application topically as needed.    . ARIPiprazole (ABILIFY) 2 MG tablet Take 2 mg by mouth daily. At breakfast    . clonazePAM (KLONOPIN) 0.5 MG tablet Take 0.5 mg by mouth 2 (two) times daily as needed for anxiety.    Marland Kitchen FIBER PO Take 100 mg by mouth at bedtime.    . lamoTRIgine (LAMICTAL) 200 MG tablet Take 200 mg by mouth daily. At breakfast    . levothyroxine (SYNTHROID, LEVOTHROID) 150 MCG tablet Take 1 tablet (150 mcg total) by mouth daily before breakfast. 30 tablet 5  . ondansetron (ZOFRAN) 8 MG tablet Take 8 mg by mouth every 8 (eight) hours as needed for nausea or vomiting.    . Probiotic  Product (PROBIOTIC DAILY PO) Take 1 tablet by mouth daily. At breakfast    . promethazine (PHENERGAN) 25 MG tablet Take 25 mg by mouth as needed for nausea or vomiting.    . nitrofurantoin (MACRODANTIN) 100 MG capsule Take 1 capsule (100 mg total) by mouth 4 (four) times daily. After intercourse 40 capsule 0  . pantoprazole (PROTONIX) 40 MG tablet Take 40 mg by mouth daily. 30 Minutes before breakfast    . SUMAtriptan (IMITREX) 100 MG tablet Take 100 mg by mouth every 2 (two) hours as needed for migraine. May repeat in 2 hours if headache persists or recurs.    . traZODone (DESYREL) 100 MG tablet Take 100 mg by mouth at bedtime.     No facility-administered medications prior to visit.    ROS Review of Systems  Constitutional: Negative for fever, chills, diaphoresis and fatigue.  HENT: Negative for tinnitus and trouble swallowing.   Eyes: Negative for visual disturbance.  Respiratory: Negative for chest tightness, shortness of breath and wheezing.   Cardiovascular: Negative for chest pain, palpitations and leg swelling.  Gastrointestinal: Positive for constipation. Negative for nausea, vomiting and diarrhea.       Constipation helped by fiber medication  Genitourinary: Negative for urgency, frequency, difficulty urinating and menstrual problem.       Vaginal odor x 3-4 months at end of the day  Skin: Negative for rash.  Neurological: Negative for dizziness, weakness, numbness and headaches.  Psychiatric/Behavioral: Negative for  suicidal ideas and sleep disturbance. The patient is not nervous/anxious.     Objective:  BP 106/70 mmHg  Pulse 88  Temp(Src) 98.3 F (36.8 C) (Oral)  Ht 5\' 5"  (1.651 m)  Wt 206 lb (93.441 kg)  BMI 34.28 kg/m2  SpO2 97%  LMP 05/06/2012  Physical Exam  Constitutional: She is oriented to person, place, and time. She appears well-developed and well-nourished. No distress.  HENT:  Head: Normocephalic and atraumatic.  Right Ear: External ear normal.  Left  Ear: External ear normal.  Mouth/Throat: No oropharyngeal exudate.  TM's clear bilaterally  Eyes: EOM are normal. Pupils are equal, round, and reactive to light. Right eye exhibits no discharge. Left eye exhibits no discharge. No scleral icterus.  Neck: Normal range of motion. Neck supple. No thyromegaly present.  Cardiovascular: Normal rate, regular rhythm, normal heart sounds and intact distal pulses.   Pulmonary/Chest: Effort normal and breath sounds normal. No respiratory distress. She has no wheezes. She has no rales. She exhibits no tenderness.  Clinical breast exam without significant findings  Abdominal: Soft. Bowel sounds are normal. She exhibits no distension and no mass. There is no tenderness. There is no rebound and no guarding.  Obese  Genitourinary: Vagina normal. No vaginal discharge found.  Musculoskeletal: Normal range of motion. She exhibits no edema or tenderness.  Lymphadenopathy:    She has no cervical adenopathy.  Neurological: She is alert and oriented to person, place, and time. No cranial nerve deficit. She exhibits normal muscle tone. Coordination normal.  Skin: Skin is warm and dry. No rash noted. She is not diaphoretic.  Psychiatric: She has a normal mood and affect. Her behavior is normal. Judgment and thought content normal.   Assessment & Plan:   Sandra Bass was seen today for annual exam.  Diagnoses and all orders for this visit:  Screening for breast cancer -     MM Digital Screening; Future  Encounter for routine gynecological examination -     Cytology - PAP  Routine general medical examination at a health care facility -     CBC with Differential/Platelet; Future -     Comprehensive metabolic panel; Future -     Hemoglobin A1c; Future -     Lipid panel; Future -     TSH; Future  Other orders -     pantoprazole (PROTONIX) 40 MG tablet; Take 1 tablet (40 mg total) by mouth daily. 30 Minutes before breakfast -     SUMAtriptan (IMITREX) 100 MG tablet;  Take 1 tablet (100 mg total) by mouth every 2 (two) hours as needed for migraine. May repeat in 2 hours if headache persists or recurs. -     nitrofurantoin (MACRODANTIN) 100 MG capsule; Take 1 capsule (100 mg total) by mouth 4 (four) times daily. After intercourse   I have changed Ms. Telfair's pantoprazole and SUMAtriptan. I am also having her maintain her Probiotic Product (PROBIOTIC DAILY PO), ARIPiprazole, lamoTRIgine, traZODone, clonazePAM, FIBER PO, acyclovir ointment, ondansetron, promethazine, levothyroxine, temazepam, and nitrofurantoin.  Meds ordered this encounter  Medications  . temazepam (RESTORIL) 30 MG capsule    Sig: Take 30 mg by mouth at bedtime as needed for sleep.  . pantoprazole (PROTONIX) 40 MG tablet    Sig: Take 1 tablet (40 mg total) by mouth daily. 30 Minutes before breakfast    Dispense:  30 tablet    Refill:  11    Order Specific Question:  Supervising Provider    Answer:  Crecencio Mc [  [id number]]  . SUMAtriptan (IMITREX) 100 MG tablet    Sig: Take 1 tablet (100 mg total) by mouth every 2 (two) hours as needed for migraine. May repeat in 2 hours if headache persists or recurs.    Dispense:  10 tablet    Refill:  11    Order Specific Question:  Supervising Provider    Answer:  Deborra Medina L [[id number]]  . nitrofurantoin (MACRODANTIN) 100 MG capsule    Sig: Take 1 capsule (100 mg total) by mouth 4 (four) times daily. After intercourse    Dispense:  40 capsule    Refill:  0    Order Specific Question:  Supervising Provider    Answer:  Crecencio Mc [[id number]]     Follow-up: Return for Needs fasting lab appointment within the next month.

## 2015-03-28 LAB — CYTOLOGY - PAP

## 2015-04-17 ENCOUNTER — Other Ambulatory Visit (INDEPENDENT_AMBULATORY_CARE_PROVIDER_SITE_OTHER): Payer: BLUE CROSS/BLUE SHIELD

## 2015-04-17 DIAGNOSIS — Z0000 Encounter for general adult medical examination without abnormal findings: Secondary | ICD-10-CM

## 2015-04-17 DIAGNOSIS — E039 Hypothyroidism, unspecified: Secondary | ICD-10-CM

## 2015-04-17 LAB — CBC WITH DIFFERENTIAL/PLATELET
BASOS ABS: 0 10*3/uL (ref 0.0–0.1)
Basophils Relative: 0.5 % (ref 0.0–3.0)
EOS ABS: 0.2 10*3/uL (ref 0.0–0.7)
Eosinophils Relative: 2 % (ref 0.0–5.0)
HCT: 44.5 % (ref 36.0–46.0)
Hemoglobin: 14.6 g/dL (ref 12.0–15.0)
LYMPHS ABS: 2.6 10*3/uL (ref 0.7–4.0)
Lymphocytes Relative: 31.9 % (ref 12.0–46.0)
MCHC: 32.7 g/dL (ref 30.0–36.0)
MCV: 81 fl (ref 78.0–100.0)
MONO ABS: 0.5 10*3/uL (ref 0.1–1.0)
MONOS PCT: 5.6 % (ref 3.0–12.0)
NEUTROS ABS: 4.9 10*3/uL (ref 1.4–7.7)
NEUTROS PCT: 60 % (ref 43.0–77.0)
PLATELETS: 242 10*3/uL (ref 150.0–400.0)
RBC: 5.5 Mil/uL — AB (ref 3.87–5.11)
RDW: 16 % — ABNORMAL HIGH (ref 11.5–15.5)
WBC: 8.1 10*3/uL (ref 4.0–10.5)

## 2015-04-17 LAB — LIPID PANEL
CHOL/HDL RATIO: 4
Cholesterol: 156 mg/dL (ref 0–200)
HDL: 36.8 mg/dL — ABNORMAL LOW (ref >39.00)
LDL Cholesterol: 93 mg/dL (ref 0–99)
NONHDL: 118.94
TRIGLYCERIDES: 132 mg/dL (ref 0.0–149.0)
VLDL: 26.4 mg/dL (ref 0.0–40.0)

## 2015-04-17 LAB — COMPREHENSIVE METABOLIC PANEL
ALK PHOS: 86 U/L (ref 39–117)
ALT: 14 U/L (ref 0–35)
AST: 12 U/L (ref 0–37)
Albumin: 4.1 g/dL (ref 3.5–5.2)
BILIRUBIN TOTAL: 0.8 mg/dL (ref 0.2–1.2)
BUN: 12 mg/dL (ref 6–23)
CO2: 27 meq/L (ref 19–32)
Calcium: 9.4 mg/dL (ref 8.4–10.5)
Chloride: 103 mEq/L (ref 96–112)
Creatinine, Ser: 0.97 mg/dL (ref 0.40–1.20)
GFR: 65.73 mL/min (ref >60.00)
GLUCOSE: 85 mg/dL (ref 70–99)
Potassium: 4.4 mEq/L (ref 3.5–5.1)
SODIUM: 138 meq/L (ref 135–145)
TOTAL PROTEIN: 6.9 g/dL (ref 6.0–8.3)

## 2015-04-17 LAB — HEMOGLOBIN A1C: Hgb A1c MFr Bld: 5.3 % (ref 4.6–6.5)

## 2015-04-17 LAB — TSH: TSH: 2.43 u[IU]/mL (ref 0.35–4.50)

## 2015-04-17 LAB — T4, FREE: FREE T4: 1.05 ng/dL (ref 0.60–1.60)

## 2015-04-24 ENCOUNTER — Encounter: Payer: Self-pay | Admitting: *Deleted

## 2015-07-14 ENCOUNTER — Other Ambulatory Visit: Payer: Self-pay

## 2015-07-14 NOTE — Telephone Encounter (Signed)
Pt is requesting a refill on Zofran, pt states that she uses it when she takes her imitrex. Pt last OV 03/27/15. Please advise, thanks

## 2015-07-16 MED ORDER — ONDANSETRON HCL 8 MG PO TABS
8.0000 mg | ORAL_TABLET | Freq: Three times a day (TID) | ORAL | Status: DC | PRN
Start: 2015-07-16 — End: 2015-09-11

## 2015-08-28 ENCOUNTER — Ambulatory Visit
Admission: RE | Admit: 2015-08-28 | Discharge: 2015-08-28 | Disposition: A | Discharge status: Home / Self Care | Payer: BLUE CROSS/BLUE SHIELD | Source: Ambulatory Visit | Attending: Nurse Practitioner | Admitting: Nurse Practitioner

## 2015-08-28 DIAGNOSIS — Z1231 Encounter for screening mammogram for malignant neoplasm of breast: Secondary | ICD-10-CM | POA: Diagnosis not present

## 2015-08-28 DIAGNOSIS — Z1239 Encounter for other screening for malignant neoplasm of breast: Secondary | ICD-10-CM

## 2015-09-01 ENCOUNTER — Other Ambulatory Visit: Payer: Self-pay | Admitting: Nurse Practitioner

## 2015-09-11 ENCOUNTER — Other Ambulatory Visit: Payer: Self-pay | Admitting: Nurse Practitioner

## 2015-09-11 NOTE — Telephone Encounter (Signed)
Zofran refilled on 07/16/15 and macrodantin refilled 03/27/15. Last seen 03/27/15 Please advise?

## 2015-10-09 ENCOUNTER — Ambulatory Visit (INDEPENDENT_AMBULATORY_CARE_PROVIDER_SITE_OTHER): Payer: BLUE CROSS/BLUE SHIELD | Admitting: Internal Medicine

## 2015-10-09 ENCOUNTER — Encounter: Payer: Self-pay | Admitting: Internal Medicine

## 2015-10-09 VITALS — BP 108/76 | HR 79 | Temp 98.0°F | Ht 65.0 in | Wt 208.0 lb

## 2015-10-09 DIAGNOSIS — Z8619 Personal history of other infectious and parasitic diseases: Secondary | ICD-10-CM | POA: Diagnosis not present

## 2015-10-09 DIAGNOSIS — E038 Other specified hypothyroidism: Secondary | ICD-10-CM

## 2015-10-09 DIAGNOSIS — G4700 Insomnia, unspecified: Secondary | ICD-10-CM | POA: Insufficient documentation

## 2015-10-09 DIAGNOSIS — K219 Gastro-esophageal reflux disease without esophagitis: Secondary | ICD-10-CM

## 2015-10-09 DIAGNOSIS — G43009 Migraine without aura, not intractable, without status migrainosus: Secondary | ICD-10-CM

## 2015-10-09 DIAGNOSIS — E039 Hypothyroidism, unspecified: Secondary | ICD-10-CM | POA: Insufficient documentation

## 2015-10-09 DIAGNOSIS — F3177 Bipolar disorder, in partial remission, most recent episode mixed: Secondary | ICD-10-CM

## 2015-10-09 DIAGNOSIS — N390 Urinary tract infection, site not specified: Secondary | ICD-10-CM | POA: Insufficient documentation

## 2015-10-09 DIAGNOSIS — K644 Residual hemorrhoidal skin tags: Secondary | ICD-10-CM

## 2015-10-09 DIAGNOSIS — K648 Other hemorrhoids: Secondary | ICD-10-CM

## 2015-10-09 MED ORDER — ONDANSETRON 8 MG PO TBDP: 8.0000 mg | ORAL_TABLET | Freq: Three times a day (TID) | ORAL | Status: DC | PRN

## 2015-10-09 MED ORDER — HYDROCORTISONE ACE-PRAMOXINE 1-1 % RE FOAM: 1.0000 | Freq: Two times a day (BID) | RECTAL | Status: DC

## 2015-10-09 NOTE — Assessment & Plan Note (Signed)
She will continue Restoril for now

## 2015-10-09 NOTE — Assessment & Plan Note (Signed)
Continue Acyclovir ointment prn

## 2015-10-09 NOTE — Progress Notes (Signed)
HPI  Pt presents to the clinic today to establish care and for management of the conditions listed below. She is transferring care from Lorane Gell, NP-C.  History of Cold Sores: She has not had any issues since she had her partial hysterectomy. She likes to keep a RX for Acyclovir ointment to use as needed.  Bipolar: She follows with Dr. Nicolasa Ducking and Cristina Gong.  She takes Lamictal, Abilify and Clonazepam. She denies SI/HI.  Insomnia: She has difficulty falling asleep. She takes Restoril with good relief.  Hypothyroidism: She takes Synthroid as directed. She has had some weight gain, but denies constipation, cold intolerance, dry skin or hair less.  Frequent UTI's: Always occur after intercourse. She takes Macrobid directly after intercourse.  Migraines: These occur once every 2 weeks. They are located in her right temple. She describe the pain as sharp and stabbing. She denies blurred vision or dizziness. She does have some sensitivity to light and sound. She has associated nausea and vomiting. She takes Imitrex and Zofran with good relief. She does request a refill of the Zofran today.  GERD: She denies breakthrough symptoms on Protonix.  Constipation. She takes a fiber supplement and Probiotic daily. She is having normal BM's daily now.  She does c/o intermittent rectal bleeding (noticing it in her underwear, not on tissue or in stool). She has associated rectal pressure and itching. She is not sure if it is a hemorrhoid or fissure. She has not taken anything OTC for this.  Flu: 02/2015 Tetanus: 03/2015 Pap Smear: 03/2015 Mammogram: 08/2015 Colon Screening: 2010 Vision Screening: yearly Dentist: biannually  Past Medical History  Diagnosis Date  . History of chicken pox   . Depression     BIPOLAR  . GERD (gastroesophageal reflux disease)   . Migraine   . UTI (lower urinary tract infection)     Current Outpatient Prescriptions  Medication Sig Dispense Refill  . acyclovir  ointment (ZOVIRAX) 5 % Apply 1 application topically as needed.    . ARIPiprazole (ABILIFY) 2 MG tablet Take 2 mg by mouth daily. At breakfast    . Ciclopirox 1 % shampoo   0  . clonazePAM (KLONOPIN) 0.5 MG tablet Take 0.5 mg by mouth 2 (two) times daily as needed for anxiety.    Marland Kitchen FIBER PO Take 100 mg by mouth at bedtime.    . LamoTRIgine 250 MG TB24 Take 1 tablet by mouth daily.  3  . levothyroxine (SYNTHROID, LEVOTHROID) 150 MCG tablet TAKE 1 TABLET (150 MCG TOTAL) BY MOUTH DAILY BEFORE BREAKFAST. 30 tablet 5  . nitrofurantoin (MACRODANTIN) 100 MG capsule TAKE 1 CAPSULE (100 MG TOTAL) BY MOUTH 4 (FOUR) TIMES DAILY. AFTER INTERCOURSE 40 capsule 0  . ondansetron (ZOFRAN-ODT) 8 MG disintegrating tablet Take 8 mg by mouth every 8 (eight) hours as needed for nausea or vomiting.    . pantoprazole (PROTONIX) 40 MG tablet Take 1 tablet (40 mg total) by mouth daily. 30 Minutes before breakfast 30 tablet 11  . Probiotic Product (PROBIOTIC DAILY PO) Take 1 tablet by mouth daily. At breakfast    . promethazine (PHENERGAN) 25 MG tablet Take 25 mg by mouth as needed for nausea or vomiting.    . SUMAtriptan (IMITREX) 100 MG tablet Take 1 tablet (100 mg total) by mouth every 2 (two) hours as needed for migraine. May repeat in 2 hours if headache persists or recurs. 10 tablet 11  . temazepam (RESTORIL) 30 MG capsule Take 30 mg by mouth at bedtime as needed  for sleep.     No current facility-administered medications for this visit.    Allergies  Allergen Reactions  . Aspirin Other (See Comments)    Nose bleeds  . Hydrocodone Itching    Clawing of the skin    Family History  Problem Relation Age of Onset  . Arthritis Mother   . Glaucoma Father   . Stroke Maternal Grandmother   . Stroke Maternal Grandfather   . Tongue cancer Paternal Grandmother   . Heart disease Paternal Grandfather   . Glaucoma Paternal Grandfather   . Lung cancer Paternal Grandfather   . Brain cancer Paternal Grandfather   .  Alzheimer's disease Paternal Grandfather     Social History   Social History  . Marital Status: Married    Spouse Name: N/A  . Number of Children: N/A  . Years of Education: N/A   Occupational History  . Not on file.   Social History Main Topics  . Smoking status: Never Smoker   . Smokeless tobacco: Never Used  . Alcohol Use: No  . Drug Use: No  . Sexual Activity:    Partners: Male     Comment: Husband   Other Topics Concern  . Not on file   Social History Narrative   Works at [employer], [place] in [place]   Lives with husband    Children- none   Pets: None    Caffeine- None, rare hot chocolate    Right handed    Enjoys baseball, taking an art class    ROS:  Constitutional: Pt reports weight gain. Denies fever, malaise, fatigue, headache .  HEENT: Denies eye pain, eye redness, ear pain, ringing in the ears, wax buildup, runny nose, nasal congestion, bloody nose, or sore throat. Respiratory: Denies difficulty breathing, shortness of breath, cough or sputum production.   Cardiovascular: Denies chest pain, chest tightness, palpitations or swelling in the hands or feet.  Gastrointestinal: Pt reports intermittent constipation, rectal bleeding. Denies abdominal pain, bloating, diarrhea.  GU: Denies frequency, urgency, pain with urination, blood in urine, odor or discharge. Musculoskeletal: Denies decrease in range of motion, difficulty with gait, muscle pain or joint pain and swelling.  Skin: Denies redness, rashes, lesions or ulcercations.  Neurological: Denies dizziness, difficulty with memory, difficulty with speech or problems with balance and coordination.  Psych: Pt has history of mania, depression and anxiety. Denies SI/HI.  No other specific complaints in a complete review of systems (except as listed in HPI above).  PE:  BP 108/76 mmHg  Pulse 79  Temp(Src) 98 F (36.7 C) (Oral)  Ht 5\' 5"  (1.651 m)  Wt 208 lb (94.348 kg)  BMI 34.61 kg/m2  SpO2 98%  LMP  05/06/2012  Wt Readings from Last 3 Encounters:  10/09/15 208 lb (94.348 kg)  03/27/15 206 lb (93.441 kg)  09/23/14 195 lb 12.8 oz (88.814 kg)    General: Appears her stated age, obese in NAD. Neck: Neck supple, trachea midline. No masses, lumps present. Thyromegaly noted.  Cardiovascular: Normal rate and rhythm. S1,S2 noted.  No murmur, rubs or gallops noted.  Pulmonary/Chest: Normal effort and positive vesicular breath sounds. No respiratory distress. No wheezes, rales or ronchi noted.  Abdomen: Soft and nontender. Normal bowel sounds. No distention or masses noted.  Rectal: External hemorrhoid noted, not bleeding or thrombosed. Normal rectal tone. No internal mass noted. Neurological: Alert and oriented.  Psychiatric: Mood and affect normal. Behavior is normal. Judgment and thought content normal.     BMET  Component Value Date/Time   NA 138 04/17/2015 1041   NA 140 03/18/2014   NA 140 07/24/2012 0541   K 4.4 04/17/2015 1041   K 3.9 07/24/2012 0541   CL 103 04/17/2015 1041   CL 109* 07/24/2012 0541   CO2 27 04/17/2015 1041   CO2 25 07/24/2012 0541   GLUCOSE 85 04/17/2015 1041   GLUCOSE 121* 07/24/2012 0541   BUN 12 04/17/2015 1041   BUN 14 03/18/2014   BUN 10 07/24/2012 0541   CREATININE 0.97 04/17/2015 1041   CREATININE 1.0 03/18/2014   CREATININE 1.13 07/24/2012 0541   CALCIUM 9.4 04/17/2015 1041   CALCIUM 8.0* 07/24/2012 0541   GFRNONAA 59* 07/24/2012 0541   GFRAA >60 07/24/2012 0541    Lipid Panel     Component Value Date/Time   CHOL 156 04/17/2015 1041   TRIG 132.0 04/17/2015 1041   HDL 36.80* 04/17/2015 1041   CHOLHDL 4 04/17/2015 1041   VLDL 26.4 04/17/2015 1041   LDLCALC 93 04/17/2015 1041    CBC    Component Value Date/Time   WBC 8.1 04/17/2015 1041   WBC 7.1 03/18/2014   WBC 12.0* 07/24/2012 0541   RBC 5.50* 04/17/2015 1041   RBC 4.32 07/24/2012 0541   HGB 14.6 04/17/2015 1041   HGB 11.4* 07/24/2012 0541   HCT 44.5 04/17/2015 1041    HCT 34.1* 07/24/2012 0541   PLT 242.0 04/17/2015 1041   PLT 228 07/24/2012 0541   MCV 81.0 04/17/2015 1041   MCV 79* 07/24/2012 0541   MCH 26.4 07/24/2012 0541   MCHC 32.7 04/17/2015 1041   MCHC 33.5 07/24/2012 0541   RDW 16.0* 04/17/2015 1041   RDW 16.7* 07/24/2012 0541   LYMPHSABS 2.6 04/17/2015 1041   MONOABS 0.5 04/17/2015 1041   EOSABS 0.2 04/17/2015 1041   BASOSABS 0.0 04/17/2015 1041    Hgb A1C Lab Results  Component Value Date   HGBA1C 5.3 04/17/2015     Assessment and Plan:  External Hemorrhoid:  Discussed staying well hydrated and taking stool softeners if needed eRx for Proctofoam to use BID until resolved If persist or worsens, can refer to GI to discuss banding  Make an appointment in 6 months for annual exam

## 2015-10-09 NOTE — Assessment & Plan Note (Signed)
Controlled with Imitrex and Zofran Zofran refilled today

## 2015-10-09 NOTE — Assessment & Plan Note (Signed)
Controlled on Protonix Discussed how weight loss could help improve reflux

## 2015-10-09 NOTE — Assessment & Plan Note (Signed)
Continue Macrobid after intercourse

## 2015-10-09 NOTE — Progress Notes (Signed)
Pre visit review using our clinic review tool, if applicable. No additional management support is needed unless otherwise documented below in the visit note. 

## 2015-10-09 NOTE — Assessment & Plan Note (Signed)
Last TSH and T4 reviewed Continue current dose of Synthroid for now

## 2015-10-09 NOTE — Assessment & Plan Note (Signed)
Stable on Lamictal, Abilify and Clonazepam She will continue to follow with Dr. Nicolasa Ducking and her therapist

## 2015-10-09 NOTE — Patient Instructions (Signed)

## 2015-10-11 ENCOUNTER — Telehealth: Payer: Self-pay

## 2015-10-11 NOTE — Telephone Encounter (Signed)
PA has been submitted through covermymeds--[employer]--waiting on response

## 2015-10-13 ENCOUNTER — Encounter: Payer: Self-pay | Admitting: Internal Medicine

## 2015-10-13 NOTE — Telephone Encounter (Signed)
Mel- I think I heard you working on the [place]. Do we know if it has been approved or denied?

## 2015-10-20 NOTE — Telephone Encounter (Signed)
Proctofoam has been denied. Alternative Hydrocortisone rectal cream 1% or Anusol HC cream. Must try and fail

## 2015-10-23 ENCOUNTER — Other Ambulatory Visit: Payer: Self-pay | Admitting: Internal Medicine

## 2015-10-23 MED ORDER — HYDROCORTISONE 2.5 % RE CREA: 1.0000 "application " | TOPICAL_CREAM | Freq: Two times a day (BID) | RECTAL | Status: DC

## 2015-10-23 NOTE — Telephone Encounter (Signed)
anusol HC cream sent to pharmacy 

## 2016-02-19 ENCOUNTER — Encounter: Payer: Self-pay | Admitting: Gastroenterology

## 2016-02-19 ENCOUNTER — Encounter: Payer: Self-pay | Admitting: Internal Medicine

## 2016-02-19 ENCOUNTER — Ambulatory Visit (INDEPENDENT_AMBULATORY_CARE_PROVIDER_SITE_OTHER): Payer: BLUE CROSS/BLUE SHIELD | Admitting: Internal Medicine

## 2016-02-19 VITALS — BP 112/80 | HR 76 | Temp 97.6°F | Wt 214.5 lb

## 2016-02-19 DIAGNOSIS — K625 Hemorrhage of anus and rectum: Secondary | ICD-10-CM | POA: Diagnosis not present

## 2016-02-19 LAB — CBC WITH DIFFERENTIAL/PLATELET
BASOS ABS: 0 10*3/uL (ref 0.0–0.1)
Basophils Relative: 0.5 % (ref 0.0–3.0)
EOS PCT: 1 % (ref 0.0–5.0)
Eosinophils Absolute: 0.1 10*3/uL (ref 0.0–0.7)
HCT: 43.2 % (ref 36.0–46.0)
HEMOGLOBIN: 14.4 g/dL (ref 12.0–15.0)
LYMPHS ABS: 2.6 10*3/uL (ref 0.7–4.0)
Lymphocytes Relative: 28.6 % (ref 12.0–46.0)
MCHC: 33.3 g/dL (ref 30.0–36.0)
MCV: 77.4 fl — AB (ref 78.0–100.0)
MONO ABS: 0.4 10*3/uL (ref 0.1–1.0)
MONOS PCT: 4.4 % (ref 3.0–12.0)
NEUTROS PCT: 65.5 % (ref 43.0–77.0)
Neutro Abs: 5.9 10*3/uL (ref 1.4–7.7)
Platelets: 296 10*3/uL (ref 150.0–400.0)
RBC: 5.59 Mil/uL — AB (ref 3.87–5.11)
RDW: 17.2 % — ABNORMAL HIGH (ref 11.5–15.5)
WBC: 9.1 10*3/uL (ref 4.0–10.5)

## 2016-02-19 MED ORDER — HYDROCORTISONE ACETATE 25 MG RE SUPP: 25.0000 mg | Freq: Two times a day (BID) | RECTAL | 0 refills | Status: DC

## 2016-02-19 NOTE — Progress Notes (Signed)
Subjective:    Patient ID: Sandra Bass, female    DOB: 02-11-1971, 45 y.o.   MRN: [id number]  HPI  Pt presents to the clinic today with c/o rectal bleeding. She reports this has been intermittent over the last month. She denies seeing any blood in her stool or when she wipes, but a few hours after a BM, her panties will be bloody. She does have some rectal pain and burning, but denies itching. She denies constipation but often has loose stool. She denies abdominal pain, nausea or vomiting. She has tried Anusol cream without any relief. She had a colonoscopy in 2010 which was normal.  She would like to get her flu shot today.  Review of Systems      Past Medical History:  Diagnosis Date  . Depression    BIPOLAR  . GERD (gastroesophageal reflux disease)   . History of chicken pox   . Migraine   . UTI (lower urinary tract infection)     Current Outpatient Prescriptions  Medication Sig Dispense Refill  . acyclovir ointment (ZOVIRAX) 5 % Apply 1 application topically as needed.    . ARIPiprazole (ABILIFY) 2 MG tablet Take 2 mg by mouth daily. At breakfast    . Ciclopirox 1 % shampoo   0  . clonazePAM (KLONOPIN) 0.5 MG tablet Take 0.5 mg by mouth 2 (two) times daily as needed for anxiety.    Marland Kitchen FIBER PO Take 100 mg by mouth at bedtime.    . LamoTRIgine 250 MG TB24 Take 1 tablet by mouth daily.  3  . levothyroxine (SYNTHROID, LEVOTHROID) 150 MCG tablet TAKE 1 TABLET (150 MCG TOTAL) BY MOUTH DAILY BEFORE BREAKFAST. 30 tablet 5  . nitrofurantoin (MACRODANTIN) 100 MG capsule TAKE 1 CAPSULE (100 MG TOTAL) BY MOUTH 4 (FOUR) TIMES DAILY. AFTER INTERCOURSE 40 capsule 0  . ondansetron (ZOFRAN-ODT) 8 MG disintegrating tablet Take 1 tablet (8 mg total) by mouth every 8 (eight) hours as needed for nausea or vomiting. 20 tablet 2  . pantoprazole (PROTONIX) 40 MG tablet Take 1 tablet (40 mg total) by mouth daily. 30 Minutes before breakfast 30 tablet 11  . Probiotic Product (PROBIOTIC DAILY PO)  Take 1 tablet by mouth daily. At breakfast    . promethazine (PHENERGAN) 25 MG tablet Take 25 mg by mouth as needed for nausea or vomiting.    . SUMAtriptan (IMITREX) 100 MG tablet Take 1 tablet (100 mg total) by mouth every 2 (two) hours as needed for migraine. May repeat in 2 hours if headache persists or recurs. 10 tablet 11  . temazepam (RESTORIL) 30 MG capsule Take 30 mg by mouth at bedtime as needed for sleep.    . hydrocortisone (ANUSOL-HC) 25 MG suppository Place 1 suppository (25 mg total) rectally 2 (two) times daily. 12 suppository 0   No current facility-administered medications for this visit.     Allergies  Allergen Reactions  . Aspirin Other (See Comments)    Nose bleeds  . Hydrocodone Itching    Clawing of the skin    Family History  Problem Relation Age of Onset  . Arthritis Mother   . Glaucoma Father   . Stroke Maternal Grandmother   . Stroke Maternal Grandfather   . Tongue cancer Paternal Grandmother   . Alzheimer's disease Paternal Grandmother   . Heart disease Paternal Grandfather   . Glaucoma Paternal Grandfather   . Lung cancer Paternal Grandfather   . Brain cancer Paternal Grandfather  Social History   Social History  . Marital status: Married    Spouse name: N/A  . Number of children: N/A  . Years of education: N/A   Occupational History  . Not on file.   Social History Main Topics  . Smoking status: Never Smoker  . Smokeless tobacco: Never Used  . Alcohol use No  . Drug use: No  . Sexual activity: Yes    Partners: Male    Birth control/ protection: Surgical     Comment: Husband   Other Topics Concern  . Not on file   Social History Narrative   Works at [employer], [place] in [place]   Lives with husband    Children- none   Pets: None    Caffeine- None, rare hot chocolate    Right handed    Enjoys baseball, taking an art class     Constitutional: Denies fever, malaise, fatigue, headache or abrupt weight changes.    Gastrointestinal: Pt reports rectal bleeding. Denies abdominal pain, bloating, constipation, diarrhea or blood in the stool.    No other specific complaints in a complete review of systems (except as listed in HPI above).  Objective:   Physical Exam   BP 112/80   Pulse 76   Temp 97.6 F (36.4 C) (Oral)   Wt 214 lb 8 oz (97.3 kg)   LMP 05/06/2012   SpO2 98%   BMI 35.69 kg/m  Wt Readings from Last 3 Encounters:  02/19/16 214 lb 8 oz (97.3 kg)  10/09/15 208 lb (94.3 kg)  03/27/15 206 lb (93.4 kg)    General: Appears her stated age, obese in NAD. Abdomen: Soft and nontender. Normal bowel sounds. No distention or masses noted. Rectal: No external mass noted. Normal rectal tone. No internal mass noted.  BMET    Component Value Date/Time   NA 138 04/17/2015 1041   NA 140 03/18/2014   NA 140 07/24/2012 0541   K 4.4 04/17/2015 1041   K 3.9 07/24/2012 0541   CL 103 04/17/2015 1041   CL 109 (H) 07/24/2012 0541   CO2 27 04/17/2015 1041   CO2 25 07/24/2012 0541   GLUCOSE 85 04/17/2015 1041   GLUCOSE 121 (H) 07/24/2012 0541   BUN 12 04/17/2015 1041   BUN 14 03/18/2014   BUN 10 07/24/2012 0541   CREATININE 0.97 04/17/2015 1041   CREATININE 1.13 07/24/2012 0541   CALCIUM 9.4 04/17/2015 1041   CALCIUM 8.0 (L) 07/24/2012 0541   GFRNONAA 59 (L) 07/24/2012 0541   GFRAA >60 07/24/2012 0541    Lipid Panel     Component Value Date/Time   CHOL 156 04/17/2015 1041   TRIG 132.0 04/17/2015 1041   HDL 36.80 (L) 04/17/2015 1041   CHOLHDL 4 04/17/2015 1041   VLDL 26.4 04/17/2015 1041   LDLCALC 93 04/17/2015 1041    CBC    Component Value Date/Time   WBC 8.1 04/17/2015 1041   RBC 5.50 (H) 04/17/2015 1041   HGB 14.6 04/17/2015 1041   HGB 11.4 (L) 07/24/2012 0541   HCT 44.5 04/17/2015 1041   HCT 34.1 (L) 07/24/2012 0541   PLT 242.0 04/17/2015 1041   PLT 228 07/24/2012 0541   MCV 81.0 04/17/2015 1041   MCV 79 (L) 07/24/2012 0541   MCH 26.4 07/24/2012 0541   MCHC 32.7  04/17/2015 1041   RDW 16.0 (H) 04/17/2015 1041   RDW 16.7 (H) 07/24/2012 0541   LYMPHSABS 2.6 04/17/2015 1041   MONOABS 0.5 04/17/2015 1041  EOSABS 0.2 04/17/2015 1041   BASOSABS 0.0 04/17/2015 1041    Hgb A1C Lab Results  Component Value Date   HGBA1C 5.3 04/17/2015        Assessment & Plan:   Rectal bleeding:  Hemoccult positive CBC today Avoid ASA and Ibuprofen containing products at this time Stop Anusol cream eRx for Anusol suppositories Referral to GI for further evaluation- see Rosaria Ferries on the way out to schedule  Flu shot today  RTC as needed Webb Silversmith, NP

## 2016-02-19 NOTE — Patient Instructions (Signed)

## 2016-02-21 ENCOUNTER — Other Ambulatory Visit: Payer: Self-pay | Admitting: Internal Medicine

## 2016-02-21 ENCOUNTER — Other Ambulatory Visit: Payer: Self-pay | Admitting: Nurse Practitioner

## 2016-03-01 ENCOUNTER — Ambulatory Visit (INDEPENDENT_AMBULATORY_CARE_PROVIDER_SITE_OTHER): Payer: BLUE CROSS/BLUE SHIELD | Admitting: Gastroenterology

## 2016-03-01 ENCOUNTER — Encounter: Payer: Self-pay | Admitting: Gastroenterology

## 2016-03-01 ENCOUNTER — Encounter: Payer: Self-pay | Admitting: Internal Medicine

## 2016-03-01 VITALS — BP 118/80 | HR 80 | Ht 65.25 in | Wt 218.0 lb

## 2016-03-01 DIAGNOSIS — K625 Hemorrhage of anus and rectum: Secondary | ICD-10-CM | POA: Insufficient documentation

## 2016-03-01 MED ORDER — NA SULFATE-K SULFATE-MG SULF 17.5-3.13-1.6 GM/177ML PO SOLN: 1.0000 | Freq: Once | ORAL | 0 refills | Status: AC

## 2016-03-01 MED ORDER — SUMATRIPTAN SUCCINATE 100 MG PO TABS: 100.0000 mg | ORAL_TABLET | ORAL | 1 refills | Status: DC | PRN

## 2016-03-01 NOTE — Telephone Encounter (Signed)
This is a new pt, you have not filled this yet, please advise if okay to refill

## 2016-03-01 NOTE — Progress Notes (Signed)
03/01/2016 Sandra Bass [id number] 06/15/1970   HISTORY OF PRESENT ILLNESS:  This is a pleasant 45 year old female who is previously known to Dr. Ardis Hughs for EGD in 04/2005 at which time she had what appeared to be possible short segment Barrett's esophagus, but biopsies showed just mild inflammation with no metaplasia.  She is here today at the request of her PCP, Webb Silversmith, NP, with complaints of rectal bleeding.  Says that recently she had been seeing small amounts of bright red blood with BM's but then now she's had at least 4 occasions where she actually had bleeding that soaked through her underwear.  Saw PCP and was given suppositories but were very expensive so she has been using some OTC suppositories.  Was hemoccult positive by her PCP but Hgb was normal.  Says that bowel habits are regular; does not strain to have a BM.  No other GI complaints.  No rectal/anal pain, but some peri-anal itching and burning sensation.   Past Medical History:  Diagnosis Date  . Anal fissure   . Anxiety   . Bipolar disorder ([place])   . Depression    BIPOLAR  . GERD (gastroesophageal reflux disease)   . History of chicken pox   . Hypothyroidism   . Migraine   . UTI (lower urinary tract infection)    Past Surgical History:  Procedure Laterality Date  .  cyst Right 2011   Benign cyst removed R great toe  . ABDOMINAL HYSTERECTOMY  2014  . CHOLECYSTECTOMY  2010  . TUBAL LIGATION  2007    reports that she has never smoked. She has never used smokeless tobacco. She reports that she does not drink alcohol or use drugs. family history includes Alzheimer's disease in her paternal grandmother; Arthritis in her mother; Brain cancer in her paternal grandfather; Glaucoma in her father and paternal grandfather; Heart disease in her paternal grandfather; Lung cancer in her paternal grandfather; Stroke in her maternal grandfather and maternal grandmother; Tongue cancer in her paternal  grandmother. Allergies  Allergen Reactions  . Aspirin Other (See Comments)    Nose bleeds  . Hydrocodone Itching    Clawing of the skin      Outpatient Encounter Prescriptions as of 03/01/2016  Medication Sig  . acyclovir ointment (ZOVIRAX) 5 % Apply 1 application topically as needed.  . ARIPiprazole (ABILIFY) 2 MG tablet Take 2 mg by mouth daily. At breakfast  . Ascorbic Acid (VITAMIN C GUMMIE PO) Take 1 tablet by mouth daily.  . Ciclopirox 1 % shampoo   . clonazePAM (KLONOPIN) 0.5 MG tablet Take 0.5 mg by mouth 2 (two) times daily as needed for anxiety.  . Cyanocobalamin (VITAMIN B-12 PO) Take 1 tablet by mouth daily.  Marland Kitchen FIBER PO Take 100 mg by mouth daily.   . LamoTRIgine 250 MG TB24 Take 1 tablet by mouth daily.  . nitrofurantoin (MACRODANTIN) 100 MG capsule TAKE 1 CAPSULE (100 MG TOTAL) BY MOUTH 4 (FOUR) TIMES DAILY. AFTER INTERCOURSE  . ondansetron (ZOFRAN-ODT) 8 MG disintegrating tablet Take 1 tablet (8 mg total) by mouth every 8 (eight) hours as needed for nausea or vomiting.  . pantoprazole (PROTONIX) 40 MG tablet Take 1 tablet (40 mg total) by mouth daily. 30 Minutes before breakfast  . Probiotic Product (PROBIOTIC DAILY PO) Take 1 tablet by mouth daily. At breakfast  . SUMAtriptan (IMITREX) 100 MG tablet Take 1 tablet (100 mg total) by mouth every 2 (two) hours as needed for migraine.  May repeat in 2 hours if headache persists or recurs.  Marland Kitchen SYNTHROID 150 MCG tablet TAKE 1 TABLET (150 MCG TOTAL) BY MOUTH DAILY BEFORE BREAKFAST.  Marland Kitchen temazepam (RESTORIL) 30 MG capsule Take 30 mg by mouth at bedtime as needed for sleep.  . [DISCONTINUED] hydrocortisone (ANUSOL-HC) 25 MG suppository Place 1 suppository (25 mg total) rectally 2 (two) times daily.  . [DISCONTINUED] promethazine (PHENERGAN) 25 MG tablet Take 25 mg by mouth as needed for nausea or vomiting.   No facility-administered encounter medications on file as of 03/01/2016.      REVIEW OF SYSTEMS  : All other systems  reviewed and negative except where noted in the History of Present Illness.   PHYSICAL EXAM: BP 118/80 (BP Location: Left Arm, Patient Position: Sitting, Cuff Size: Normal)   Pulse 80   Ht 5' 5.25" (1.657 m) Comment: height measured without shoes  Wt 218 lb (98.9 kg)   LMP 05/06/2012   BMI 36.00 kg/m  General: Well developed white female in no acute distress Head: Normocephalic and atraumatic Eyes:  Sclerae anicteric, conjunctiva pink. Ears: Normal auditory acuity. Lungs: Clear throughout to auscultation Heart: Regular rate and rhythm Abdomen: Soft, non-distended.  Normal bowel sounds.  Non-tender. Rectal:  Small anterior skin tag noted but no external hemorrhoids.  Some peri-anal irritation noted.  DRE did not reveal any masses.  Soft light brown stool on exam glove, not hemocculted. Musculoskeletal: Symmetrical with no gross deformities  Skin: No lesions on visible extremities Extremities: No edema  Neurological: Alert oriented x 4, grossly non-focal Psychological:  Alert and cooperative. Normal mood and affect  ASSESSMENT AND PLAN: -Rectal bleeding:  May very well be from internal hemorrhoids but last colonoscopy was 2006.  Will schedule for colonoscopy with Dr. Ardis Hughs.  If no other source of bleeding found then may need internal hemorrhoid banding.  The risks, benefits, and alternatives to colonoscopy were discussed with the patient and she consents to proceed.  Recommended using Desitin or other zinc oxide product for perianal irritation.  CC:  Jearld Fenton, NP

## 2016-03-01 NOTE — Patient Instructions (Addendum)
You have been scheduled for a colonoscopy. Please follow written instructions given to you at your visit today.  Please pick up your prep supplies at the pharmacy within the next 1-3 days. [facility]. If you use inhalers (even only as needed), please bring them with you on the day of your procedure. Your physician has requested that you go to www.startemmi.com and enter the access code given to you at your visit today. This web site gives a general overview about your procedure. However, you should still follow specific instructions given to you by our office regarding your preparation for the procedure.

## 2016-03-01 NOTE — Progress Notes (Signed)
I agree with the above note, plan 

## 2016-04-08 ENCOUNTER — Other Ambulatory Visit (HOSPITAL_COMMUNITY)
Admission: RE | Admit: 2016-04-08 | Discharge: 2016-04-08 | Disposition: A | Discharge status: Home / Self Care | Payer: BLUE CROSS/BLUE SHIELD | Source: Ambulatory Visit | Attending: Family Medicine | Admitting: Family Medicine

## 2016-04-08 ENCOUNTER — Ambulatory Visit (INDEPENDENT_AMBULATORY_CARE_PROVIDER_SITE_OTHER): Payer: BLUE CROSS/BLUE SHIELD | Admitting: Internal Medicine

## 2016-04-08 ENCOUNTER — Encounter: Payer: Self-pay | Admitting: Internal Medicine

## 2016-04-08 VITALS — BP 116/68 | HR 85 | Temp 97.9°F | Ht 65.0 in | Wt 218.5 lb

## 2016-04-08 DIAGNOSIS — Z0000 Encounter for general adult medical examination without abnormal findings: Secondary | ICD-10-CM | POA: Diagnosis not present

## 2016-04-08 DIAGNOSIS — Z124 Encounter for screening for malignant neoplasm of cervix: Secondary | ICD-10-CM

## 2016-04-08 DIAGNOSIS — Z01419 Encounter for gynecological examination (general) (routine) without abnormal findings: Secondary | ICD-10-CM | POA: Diagnosis present

## 2016-04-08 LAB — COMPREHENSIVE METABOLIC PANEL
ALBUMIN: 4 g/dL (ref 3.5–5.2)
ALT: 16 U/L (ref 0–35)
AST: 12 U/L (ref 0–37)
Alkaline Phosphatase: 100 U/L (ref 39–117)
BUN: 20 mg/dL (ref 6–23)
CHLORIDE: 104 meq/L (ref 96–112)
CO2: 29 mEq/L (ref 19–32)
CREATININE: 1.06 mg/dL (ref 0.40–1.20)
Calcium: 9.2 mg/dL (ref 8.4–10.5)
GFR: 59.08 mL/min — ABNORMAL LOW (ref >60.00)
GLUCOSE: 95 mg/dL (ref 70–99)
POTASSIUM: 4.3 meq/L (ref 3.5–5.1)
SODIUM: 139 meq/L (ref 135–145)
TOTAL PROTEIN: 7.3 g/dL (ref 6.0–8.3)
Total Bilirubin: 0.6 mg/dL (ref 0.2–1.2)

## 2016-04-08 LAB — CBC
HCT: 42.1 % (ref 36.0–46.0)
Hemoglobin: 13.7 g/dL (ref 12.0–15.0)
MCHC: 32.6 g/dL (ref 30.0–36.0)
MCV: 79.4 fl (ref 78.0–100.0)
Platelets: 274 10*3/uL (ref 150.0–400.0)
RBC: 5.3 Mil/uL — AB (ref 3.87–5.11)
RDW: 16.4 % — AB (ref 11.5–15.5)
WBC: 7.4 10*3/uL (ref 4.0–10.5)

## 2016-04-08 LAB — TSH: TSH: 0.83 u[IU]/mL (ref 0.35–4.50)

## 2016-04-08 LAB — LIPID PANEL
CHOL/HDL RATIO: 4
Cholesterol: 192 mg/dL (ref 0–200)
HDL: 44.4 mg/dL (ref >39.00)
LDL CALC: 134 mg/dL — AB (ref 0–99)
NONHDL: 148.01
TRIGLYCERIDES: 72 mg/dL (ref 0.0–149.0)
VLDL: 14.4 mg/dL (ref 0.0–40.0)

## 2016-04-08 LAB — T4, FREE: Free T4: 1.2 ng/dL (ref 0.60–1.60)

## 2016-04-08 MED ORDER — PANTOPRAZOLE SODIUM 40 MG PO TBEC: 40.0000 mg | DELAYED_RELEASE_TABLET | Freq: Every day | ORAL | 3 refills | Status: DC

## 2016-04-08 MED ORDER — SUMATRIPTAN SUCCINATE 100 MG PO TABS: 100.0000 mg | ORAL_TABLET | ORAL | 11 refills | Status: DC | PRN

## 2016-04-08 MED ORDER — NITROFURANTOIN MACROCRYSTAL 100 MG PO CAPS: ORAL_CAPSULE | ORAL | 5 refills | Status: DC

## 2016-04-08 NOTE — Patient Instructions (Signed)

## 2016-04-08 NOTE — Addendum Note (Signed)
Addended by: Lurlean Nanny on: 04/08/2016 04:52 PM   Modules accepted: Orders

## 2016-04-08 NOTE — Progress Notes (Signed)
Subjective:    Patient ID: Sandra Bass, female    DOB: Feb 08, 1971, 45 y.o.   MRN: [id number]  HPI  Pt presents to the clinic today for her annual exam.  Flu: 02/2016 Tetanus: 03/2015 Pap Smear: 03/2015, partial hysterectomy- still has cervix and ovaries Mammogram: 08/2015 Colon Screening: 2010, schedules 05/13/16 Vision Screening: yearly Dentist: biannually  Diet: She does eat meat. She does consumes more veggies than fruits, most days of the week. She does eat some fried foods. She drinks mostly water. Exercise: None  She is requesting refills of Protonix, Imitrex and Macrodantin today.  Review of Systems      Past Medical History:  Diagnosis Date  . Anal fissure   . Anxiety   . Bipolar disorder ([place])   . Depression    BIPOLAR  . GERD (gastroesophageal reflux disease)   . History of chicken pox   . Hypothyroidism   . Migraine   . UTI (lower urinary tract infection)     Current Outpatient Prescriptions  Medication Sig Dispense Refill  . acyclovir ointment (ZOVIRAX) 5 % Apply 1 application topically as needed.    . ARIPiprazole (ABILIFY) 2 MG tablet Take 2 mg by mouth daily. At breakfast    . Ascorbic Acid (VITAMIN C GUMMIE PO) Take 1 tablet by mouth daily.    . Ciclopirox 1 % shampoo   0  . clonazePAM (KLONOPIN) 0.5 MG tablet Take 0.5 mg by mouth 2 (two) times daily as needed for anxiety.    . Cyanocobalamin (VITAMIN B-12 PO) Take 1 tablet by mouth daily.    Marland Kitchen FIBER PO Take 100 mg by mouth daily.     . LamoTRIgine 250 MG TB24 Take 1 tablet by mouth daily.  3  . nitrofurantoin (MACRODANTIN) 100 MG capsule TAKE 1 CAPSULE (100 MG TOTAL) BY MOUTH 4 (FOUR) TIMES DAILY. AFTER INTERCOURSE 40 capsule 0  . ondansetron (ZOFRAN-ODT) 8 MG disintegrating tablet Take 1 tablet (8 mg total) by mouth every 8 (eight) hours as needed for nausea or vomiting. 20 tablet 2  . pantoprazole (PROTONIX) 40 MG tablet Take 1 tablet (40 mg total) by mouth daily. 30 Minutes before breakfast 30  tablet 11  . Probiotic Product (PROBIOTIC DAILY PO) Take 1 tablet by mouth daily. At breakfast    . SUMAtriptan (IMITREX) 100 MG tablet Take 1 tablet (100 mg total) by mouth every 2 (two) hours as needed for migraine. May repeat in 2 hours if headache persists or recurs. 10 tablet 1  . SYNTHROID 150 MCG tablet TAKE 1 TABLET (150 MCG TOTAL) BY MOUTH DAILY BEFORE BREAKFAST. 30 tablet 2  . temazepam (RESTORIL) 30 MG capsule Take 30 mg by mouth at bedtime as needed for sleep.     No current facility-administered medications for this visit.     Allergies  Allergen Reactions  . Aspirin Other (See Comments)    Nose bleeds  . Hydrocodone Itching    Clawing of the skin    Family History  Problem Relation Age of Onset  . Arthritis Mother   . Glaucoma Father   . Stroke Maternal Grandmother   . Stroke Maternal Grandfather   . Tongue cancer Paternal Grandmother   . Alzheimer's disease Paternal Grandmother   . Heart disease Paternal Grandfather   . Glaucoma Paternal Grandfather   . Lung cancer Paternal Grandfather   . Brain cancer Paternal Grandfather     Social History   Social History  . Marital status: Married  Spouse name: N/A  . Number of children: 0  . Years of education: N/A   Occupational History  . bookkeeper    Social History Main Topics  . Smoking status: Never Smoker  . Smokeless tobacco: Never Used  . Alcohol use No  . Drug use: No  . Sexual activity: Yes    Partners: Male    Birth control/ protection: Surgical     Comment: Husband   Other Topics Concern  . Not on file   Social History Narrative   Works at [employer] in [place]   Lives with husband    Children- none   Pets: None    Caffeine- None, rare hot chocolate    Right handed    Enjoys baseball, taking an art class     Constitutional: Pt reports weight gain. Denies fever, malaise, fatigue, headache HEENT: Denies eye pain, eye redness, ear pain, ringing in the ears, wax buildup, runny  nose, nasal congestion, bloody nose, or sore throat. Respiratory: Denies difficulty breathing, shortness of breath, cough or sputum production.   Cardiovascular: Denies chest pain, chest tightness, palpitations or swelling in the hands or feet.  Gastrointestinal: Pt reports intermittent blood in her stool. Denies abdominal pain, bloating, constipation, diarrhea.  GU: Pt reports frequent UTI's. Denies urgency, frequency, pain with urination, burning sensation, blood in urine, odor or discharge. Musculoskeletal: Denies decrease in range of motion, difficulty with gait, muscle pain or joint pain and swelling.  Skin: Denies redness, rashes, lesions or ulcercations.  Neurological: Denies dizziness, difficulty with memory, difficulty with speech or problems with balance and coordination.  Psych: Pt has history of anxiety and depression. Denies SI/HI.  No other specific complaints in a complete review of systems (except as listed in HPI above).  Objective:   Physical Exam  BP 116/68   Pulse 85   Temp 97.9 F (36.6 C) (Oral)   Ht 5\' 5"  (1.651 m)   Wt 218 lb 8 oz (99.1 kg)   LMP 05/06/2012   SpO2 99%   BMI 36.36 kg/m  Wt Readings from Last 3 Encounters:  04/08/16 218 lb 8 oz (99.1 kg)  03/01/16 218 lb (98.9 kg)  02/19/16 214 lb 8 oz (97.3 kg)    General: Appears her stated age, obese in NAD. Skin: Warm, dry and intact.  HEENT: Head: normal shape and size; Eyes: sclera white, no icterus, conjunctiva pink, PERRLA and EOMs intact; Ears: Tm's gray and intact, normal light reflex; Throat/Mouth: Teeth present, mucosa pink and moist, no exudate, lesions or ulcerations noted.  Neck:  Neck supple, trachea midline. No masses, lumps present.  Cardiovascular: Normal rate and rhythm. S1,S2 noted.  No murmur, rubs or gallops noted. No JVD or BLE edema. No carotid bruits noted. Pulmonary/Chest: Normal effort and positive vesicular breath sounds. No respiratory distress. No wheezes, rales or ronchi noted.   Abdomen: Soft and nontender. Hypoactive bowel sounds. No distention or masses noted. Liver, spleen and kidneys non palpable. Pelvic: Normal female anatomy. Cervix without changes. No discharge noted. Adnexa non palpable. Musculoskeletal: Normal range of motion. Strength 5/5 BUE.BLe. No difficulty with gait.  Neurological: Alert and oriented. Cranial nerves II-XII grossly intact. Coordination normal.  Psychiatric: Mood and affect mildly flat. Behavior is normal. Judgment and thought content normal.    BMET    Component Value Date/Time   NA 138 04/17/2015 1041   NA 140 03/18/2014   NA 140 07/24/2012 0541   K 4.4 04/17/2015 1041   K 3.9 07/24/2012 0541  CL 103 04/17/2015 1041   CL 109 (H) 07/24/2012 0541   CO2 27 04/17/2015 1041   CO2 25 07/24/2012 0541   GLUCOSE 85 04/17/2015 1041   GLUCOSE 121 (H) 07/24/2012 0541   BUN 12 04/17/2015 1041   BUN 14 03/18/2014   BUN 10 07/24/2012 0541   CREATININE 0.97 04/17/2015 1041   CREATININE 1.13 07/24/2012 0541   CALCIUM 9.4 04/17/2015 1041   CALCIUM 8.0 (L) 07/24/2012 0541   GFRNONAA 59 (L) 07/24/2012 0541   GFRAA >60 07/24/2012 0541    Lipid Panel     Component Value Date/Time   CHOL 156 04/17/2015 1041   TRIG 132.0 04/17/2015 1041   HDL 36.80 (L) 04/17/2015 1041   CHOLHDL 4 04/17/2015 1041   VLDL 26.4 04/17/2015 1041   LDLCALC 93 04/17/2015 1041    CBC    Component Value Date/Time   WBC 9.1 02/19/2016 1040   RBC 5.59 (H) 02/19/2016 1040   HGB 14.4 02/19/2016 1040   HGB 11.4 (L) 07/24/2012 0541   HCT 43.2 02/19/2016 1040   HCT 34.1 (L) 07/24/2012 0541   PLT 296.0 02/19/2016 1040   PLT 228 07/24/2012 0541   MCV 77.4 (L) 02/19/2016 1040   MCV 79 (L) 07/24/2012 0541   MCH 26.4 07/24/2012 0541   MCHC 33.3 02/19/2016 1040   RDW 17.2 (H) 02/19/2016 1040   RDW 16.7 (H) 07/24/2012 0541   LYMPHSABS 2.6 02/19/2016 1040   MONOABS 0.4 02/19/2016 1040   EOSABS 0.1 02/19/2016 1040   BASOSABS 0.0 02/19/2016 1040    Hgb  A1C Lab Results  Component Value Date   HGBA1C 5.3 04/17/2015            Assessment & Plan:   Preventative Health Maintenance:  Flu and tetanus UTD Mammogram and pap smear UTD (she is requesting annual pap today) Colonoscopy scheduled Encouraged her to consume a balanced diet and exercise regimen Advised her to see an eye doctor and dentist annually Will check CBC, CMET, Lipid, TSH and T4 today  RTC in 1 year for annual exam/follow up Webb Silversmith, NP

## 2016-04-11 LAB — CYTOLOGY - PAP: Diagnosis: NEGATIVE

## 2016-05-13 ENCOUNTER — Encounter: Payer: Self-pay | Admitting: Gastroenterology

## 2016-05-13 ENCOUNTER — Ambulatory Visit (AMBULATORY_SURGERY_CENTER): Payer: BLUE CROSS/BLUE SHIELD | Admitting: Gastroenterology

## 2016-05-13 VITALS — BP 145/92 | HR 76 | Temp 98.4°F | Resp 26 | Ht 65.0 in | Wt 218.0 lb

## 2016-05-13 DIAGNOSIS — K625 Hemorrhage of anus and rectum: Secondary | ICD-10-CM | POA: Diagnosis present

## 2016-05-13 DIAGNOSIS — D125 Benign neoplasm of sigmoid colon: Secondary | ICD-10-CM

## 2016-05-13 MED ORDER — FLUCONAZOLE 100 MG PO TABS: 100.0000 mg | ORAL_TABLET | Freq: Every day | ORAL | 0 refills | Status: AC

## 2016-05-13 MED ORDER — SODIUM CHLORIDE 0.9 % IV SOLN: 500.0000 mL | INTRAVENOUS | Status: DC

## 2016-05-13 NOTE — Patient Instructions (Signed)
YOU HAD AN ENDOSCOPIC PROCEDURE TODAY AT THE [facility]:   Refer to the procedure report that was given to you for any specific questions about what was found during the examination.  If the procedure report does not answer your questions, please call your gastroenterologist to clarify.  If you requested that your care partner not be given the details of your procedure findings, then the procedure report has been included in a sealed envelope for you to review at your convenience later.  YOU SHOULD EXPECT: Some feelings of bloating in the abdomen. Passage of more gas than usual.  Walking can help get rid of the air that was put into your GI tract during the procedure and reduce the bloating. If you had a lower endoscopy (such as a colonoscopy or flexible sigmoidoscopy) you may notice spotting of blood in your stool or on the toilet paper. If you underwent a bowel prep for your procedure, you may not have a normal bowel movement for a few days.  Please Note:  You might notice some irritation and congestion in your nose or some drainage.  This is from the oxygen used during your procedure.  There is no need for concern and it should clear up in a day or so.  SYMPTOMS TO REPORT IMMEDIATELY:   Following lower endoscopy (colonoscopy or flexible sigmoidoscopy):  Excessive amounts of blood in the stool  Significant tenderness or worsening of abdominal pains  Swelling of the abdomen that is new, acute  Fever of 100F or higher   For urgent or emergent issues, a gastroenterologist can be reached at any hour by calling [phone]. Please read all handouts given to you by your recovery nurse today.  DIET:  We do recommend a small meal at first, but then you may proceed to your regular diet.  Drink plenty of fluids but you should avoid alcoholic beverages for 24 hours.  ACTIVITY:  You should plan to take it easy for the rest of today and you should NOT DRIVE or use heavy machinery until  tomorrow (because of the sedation medicines used during the test).    FOLLOW UP: Our staff will call the number listed on your records the next business day following your procedure to check on you and address any questions or concerns that you may have regarding the information given to you following your procedure. If we do not reach you, we will leave a message.  However, if you are feeling well and you are not experiencing any problems, there is no need to return our call.  We will assume that you have returned to your regular daily activities without incident.  If any biopsies were taken you will be contacted by phone or by letter within the next 1-3 weeks.  Please call us at [phone] if you have not heard about the biopsies in 3 weeks.    SIGNATURES/CONFIDENTIALITY: You and/or your care partner have signed paperwork which will be entered into your electronic medical record.  These signatures attest to the fact that that the information above on your After Visit Summary has been reviewed and is understood.  Full responsibility of the confidentiality of this discharge information lies with you and/or your care-partner.  Thank you for letting us take care of your healthcare needs today. 

## 2016-05-13 NOTE — Progress Notes (Signed)
To PACU, vss patent aw report to rn 

## 2016-05-13 NOTE — Progress Notes (Signed)
Called to room to assist during endoscopic procedure.  Patient ID and intended procedure confirmed with present staff. Received instructions for my participation in the procedure from the performing physician.  

## 2016-05-13 NOTE — Op Note (Signed)
[facility] Patient Name: Sandra Bass Procedure Date: 05/13/2016 2:15 PM MRN: [id number] Endoscopist: Milus Banister , MD Age: 46 Referring MD:  Date of Birth: Jan 03, 1971 Gender: Female Account #: [id number] Procedure:                Colonoscopy Indications:              Rectal bleeding Medicines:                Monitored Anesthesia Care Procedure:                Pre-Anesthesia Assessment:                           - Prior to the procedure, a History and Physical                            was performed, and patient medications and                            allergies were reviewed. The patient's tolerance of                            previous anesthesia was also reviewed. The risks                            and benefits of the procedure and the sedation                            options and risks were discussed with the patient.                            All questions were answered, and informed consent                            was obtained. Prior Anticoagulants: The patient has                            taken no previous anticoagulant or antiplatelet                            agents. ASA Grade Assessment: II - A patient with                            mild systemic disease. After reviewing the risks                            and benefits, the patient was deemed in                            satisfactory condition to undergo the procedure.                           After obtaining informed consent, the colonoscope  was passed under direct vision. Throughout the                            procedure, the patient's blood pressure, pulse, and                            oxygen saturations were monitored continuously. The                            Model PCF-H190L [id number]) scope was introduced                            through the anus and advanced to the the cecum,                            identified by appendiceal orifice and ileocecal                             valve. The colonoscopy was performed without                            difficulty. The patient tolerated the procedure                            well. The quality of the bowel preparation was                            excellent. The terminal ileum, ileocecal valve,                            appendiceal orifice, and rectum were photographed. Scope In: 2:26:26 PM Scope Out: [id number] PM Scope Withdrawal Time: 0 hours 8 minutes 9 seconds  Total Procedure Duration: 0 hours 10 minutes 22 seconds  Findings:                 The terminal ileum appeared normal.                           A 3 mm polyp was found in the sigmoid colon. The                            polyp was sessile. The polyp was removed with a                            cold snare. Resection and retrieval were complete.                           The exam was otherwise without abnormality on                            direct and retroflexion views. No hemorrhoids.                           The perianal skin was slightly excoriated (radius  4-5cm around anus). Complications:            No immediate complications. Estimated blood loss:                            None. Estimated Blood Loss:     Estimated blood loss: none. Impression:               - The examined portion of the ileum was normal.                           - One 3 mm polyp in the sigmoid colon, removed with                            a cold snare. Resected and retrieved.                           - Perianal skin was slightly excoriated. This may                            be medicine related. Recommendation:           - Patient has a contact number available for                            emergencies. The signs and symptoms of potential                            delayed complications were discussed with the                            patient. Return to normal activities tomorrow.                            Written  discharge instructions were provided to the                            patient.                           - Resume previous diet.                           - Continue present medications.                           - Please stop using any topical ointments on your                            backside. Gently clean the area once daily                            (maximum) while bathing, gentle soaps, no                            washcloths. You can use small amount of vaseline  for the next 1-2 weeks if the area is irritated                            after cleaning.                           - Call to report on your progress in 3-4 weeks.                           You will receive a letter within 2-3 weeks with the                            pathology results and my final recommendations.                           If the polyp(s) is proven to be 'pre-cancerous' on                            pathology, you will need repeat colonoscopy in 5                            years. If the polyp(s) is NOT 'precancerous' on                            pathology then you should repeat colon cancer                            screening in 10 years with colonoscopy without need                            for colon cancer screening by any method prior to                            then (including stool testing). Milus Banister, MD 05/13/2016 2:46:32 PM This report has been signed electronically.

## 2016-05-14 ENCOUNTER — Telehealth: Payer: Self-pay | Admitting: *Deleted

## 2016-05-14 NOTE — Telephone Encounter (Signed)
  Follow up Call-  Call back number 05/13/2016  Post procedure Call Back phone  # [phone]  Permission to leave phone message Yes  Some recent data might be hidden     Patient questions:  Do you have a fever, pain , or abdominal swelling? No. Pain Score  0 *  Have you tolerated food without any problems? Yes.    Have you been able to return to your normal activities? Yes.    Do you have any questions about your discharge instructions: Diet   No. Medications  No. Follow up visit  No.  Do you have questions or concerns about your Care? No.  Actions: * If pain score is 4 or above: No action needed, pain <4.

## 2016-05-17 ENCOUNTER — Encounter: Payer: Self-pay | Admitting: Gastroenterology

## 2016-05-31 ENCOUNTER — Other Ambulatory Visit: Payer: Self-pay

## 2016-05-31 MED ORDER — LEVOTHYROXINE SODIUM 150 MCG PO TABS: ORAL_TABLET | ORAL | 3 refills | Status: DC

## 2016-10-02 ENCOUNTER — Other Ambulatory Visit: Payer: Self-pay | Admitting: Internal Medicine

## 2016-10-02 ENCOUNTER — Encounter: Payer: Self-pay | Admitting: Internal Medicine

## 2016-10-02 DIAGNOSIS — Z1231 Encounter for screening mammogram for malignant neoplasm of breast: Secondary | ICD-10-CM

## 2016-11-13 ENCOUNTER — Ambulatory Visit (INDEPENDENT_AMBULATORY_CARE_PROVIDER_SITE_OTHER): Payer: BLUE CROSS/BLUE SHIELD | Admitting: Internal Medicine

## 2016-11-13 ENCOUNTER — Encounter: Payer: Self-pay | Admitting: Internal Medicine

## 2016-11-13 VITALS — BP 116/80 | HR 83 | Temp 98.2°F | Wt 215.0 lb

## 2016-11-13 DIAGNOSIS — E039 Hypothyroidism, unspecified: Secondary | ICD-10-CM

## 2016-11-13 DIAGNOSIS — H60542 Acute eczematoid otitis externa, left ear: Secondary | ICD-10-CM

## 2016-11-13 DIAGNOSIS — K5900 Constipation, unspecified: Secondary | ICD-10-CM | POA: Diagnosis not present

## 2016-11-13 DIAGNOSIS — R5383 Other fatigue: Secondary | ICD-10-CM

## 2016-11-13 LAB — TSH: TSH: 1.09 u[IU]/mL (ref 0.35–4.50)

## 2016-11-13 LAB — T4, FREE: FREE T4: 0.98 ng/dL (ref 0.60–1.60)

## 2016-11-13 MED ORDER — HYDROCORTISONE-ACETIC ACID 1-2 % OT SOLN: 2.0000 [drp] | Freq: Two times a day (BID) | OTIC | 0 refills | Status: DC | PRN

## 2016-11-13 NOTE — Progress Notes (Signed)
Subjective:    Patient ID: Sandra Bass, female    DOB: 20-Oct-1970, 46 y.o.   MRN: [id number]  HPI  Pt presents to the clinic today with multiple complaints.  Fatigue: She has been having trouble falling asleep despite the Temazepam. She gets about 7 hours of sleep per night. She does not feel rested when she wakes up but this is not new for her. She has had a sleep study but reports she did not need CPAP. She feels like the depression is worse recently. Her psychiatrist has been making a lot of changes to her medication. She recently started Zoloft. She is also taking Clonazepam and Lamictal.  Constipation: She has been having bowel movements every other day. Her stools are not hard and she does not strain. She denies abdominal pain or blood in her stool. She denies changes in diet. She reports she recently started Zoloft and is not sure if this may be causing her problem. She has not taken anything OTC for this.  She also c/o left ear drainage. She noticed this in the last few weeks. She reports the drainage is light. Her ear itches. She denies ear pain. She has not tried anything OTC for this.  Review of Systems      Past Medical History:  Diagnosis Date  . Anal fissure   . Anxiety   . Bipolar disorder ([place])   . Depression    BIPOLAR  . GERD (gastroesophageal reflux disease)   . History of chicken pox   . Hypothyroidism   . Migraine   . UTI (lower urinary tract infection)     Current Outpatient Prescriptions  Medication Sig Dispense Refill  . acyclovir ointment (ZOVIRAX) 5 % Apply 1 application topically as needed.    . Ascorbic Acid (VITAMIN C GUMMIE PO) Take 1 tablet by mouth daily.    . clonazePAM (KLONOPIN) 0.5 MG tablet Take 0.5 mg by mouth 2 (two) times daily as needed for anxiety.    . Cyanocobalamin (VITAMIN B-12 PO) Take 1 tablet by mouth daily.    Marland Kitchen FIBER PO Take 2 capsules by mouth daily.     . LamoTRIgine 250 MG TB24 Take 1 tablet by mouth daily.  3  .  levothyroxine (SYNTHROID) 150 MCG tablet TAKE 1 TABLET (150 MCG TOTAL) BY MOUTH DAILY BEFORE BREAKFAST. 90 tablet 3  . nitrofurantoin (MACRODANTIN) 100 MG capsule TAKE 1 CAPSULE (100 MG TOTAL) BY MOUTH daily as needed after intercourse 30 capsule 5  . ondansetron (ZOFRAN-ODT) 8 MG disintegrating tablet Take 1 tablet (8 mg total) by mouth every 8 (eight) hours as needed for nausea or vomiting. 20 tablet 2  . pantoprazole (PROTONIX) 40 MG tablet Take 1 tablet (40 mg total) by mouth daily. 30 Minutes before breakfast 90 tablet 3  . Probiotic Product (PROBIOTIC DAILY PO) Take 1 tablet by mouth daily. At breakfast    . sertraline (ZOLOFT) 100 MG tablet Take 150 mg by mouth daily.  1  . SUMAtriptan (IMITREX) 100 MG tablet Take 1 tablet (100 mg total) by mouth every 2 (two) hours as needed for migraine. May repeat in 2 hours if headache persists or recurs. 10 tablet 11  . temazepam (RESTORIL) 30 MG capsule Take 30 mg by mouth at bedtime as needed for sleep.     Current Facility-Administered Medications  Medication Dose Route Frequency Provider Last Rate Last Dose  . 0.9 %  sodium chloride infusion  500 mL Intravenous Continuous Milus Banister, MD  Allergies  Allergen Reactions  . Aspirin Other (See Comments)    Nose bleeds  . Hydrocodone Itching    Clawing of the skin    Family History  Problem Relation Age of Onset  . Arthritis Mother   . Glaucoma Father   . Stroke Maternal Grandmother   . Stroke Maternal Grandfather   . Tongue cancer Paternal Grandmother   . Alzheimer's disease Paternal Grandmother   . Heart disease Paternal Grandfather   . Glaucoma Paternal Grandfather   . Lung cancer Paternal Grandfather   . Brain cancer Paternal Grandfather     Social History   Social History  . Marital status: Married    Spouse name: N/A  . Number of children: 0  . Years of education: N/A   Occupational History  . bookkeeper    Social History Main Topics  . Smoking status: Never  Smoker  . Smokeless tobacco: Never Used  . Alcohol use No  . Drug use: No  . Sexual activity: Yes    Partners: Male    Birth control/ protection: Surgical     Comment: Husband   Other Topics Concern  . Not on file   Social History Narrative   Works at [employer] in [place]   Lives with husband    Children- none   Pets: None    Caffeine- None, rare hot chocolate    Right handed    Enjoys baseball, taking an art class     Constitutional: Pt reports fatigue. Denies fever, malaise, headache or abrupt weight changes.  HEENT: Pt reports left ear drainage. Denies eye pain, eye redness, ear pain, ringing in the ears, wax buildup, runny nose, nasal congestion, bloody nose, or sore throat. Gastrointestinal: Pt reports constipation. Denies abdominal pain, bloating, diarrhea or blood in the stool.    No other specific complaints in a complete review of systems (except as listed in HPI above).  Objective:   Physical Exam  BP 116/80   Pulse 83   Temp 98.2 F (36.8 C) (Oral)   Wt 215 lb (97.5 kg)   LMP 05/06/2012   SpO2 98%   BMI 35.78 kg/m  Wt Readings from Last 3 Encounters:  11/13/16 215 lb (97.5 kg)  05/13/16 218 lb (98.9 kg)  04/08/16 218 lb 8 oz (99.1 kg)    General: Appears her stated age, obese in NAD. HEENT:  Left Ears: Eczema noted of left ear canal. Abdomen: Soft and nontender. Normal bowel sounds. No distention or masses noted.    BMET    Component Value Date/Time   NA 139 04/08/2016 0845   NA 140 03/18/2014   NA 140 07/24/2012 0541   K 4.3 04/08/2016 0845   K 3.9 07/24/2012 0541   CL 104 04/08/2016 0845   CL 109 (H) 07/24/2012 0541   CO2 29 04/08/2016 0845   CO2 25 07/24/2012 0541   GLUCOSE 95 04/08/2016 0845   GLUCOSE 121 (H) 07/24/2012 0541   BUN 20 04/08/2016 0845   BUN 14 03/18/2014   BUN 10 07/24/2012 0541   CREATININE 1.06 04/08/2016 0845   CREATININE 1.13 07/24/2012 0541   CALCIUM 9.2 04/08/2016 0845   CALCIUM 8.0 (L) 07/24/2012 0541     GFRNONAA 59 (L) 07/24/2012 0541   GFRAA >60 07/24/2012 0541    Lipid Panel     Component Value Date/Time   CHOL 192 04/08/2016 0845   TRIG 72.0 04/08/2016 0845   HDL 44.40 04/08/2016 0845   CHOLHDL 4 04/08/2016  0845   VLDL 14.4 04/08/2016 0845   LDLCALC 134 (H) 04/08/2016 0845    CBC    Component Value Date/Time   WBC 7.4 04/08/2016 0845   RBC 5.30 (H) 04/08/2016 0845   HGB 13.7 04/08/2016 0845   HGB 11.4 (L) 07/24/2012 0541   HCT 42.1 04/08/2016 0845   HCT 34.1 (L) 07/24/2012 0541   PLT 274.0 04/08/2016 0845   PLT 228 07/24/2012 0541   MCV 79.4 04/08/2016 0845   MCV 79 (L) 07/24/2012 0541   MCH 26.4 07/24/2012 0541   MCHC 32.6 04/08/2016 0845   RDW 16.4 (H) 04/08/2016 0845   RDW 16.7 (H) 07/24/2012 0541   LYMPHSABS 2.6 02/19/2016 1040   MONOABS 0.4 02/19/2016 1040   EOSABS 0.1 02/19/2016 1040   BASOSABS 0.0 02/19/2016 1040    Hgb A1C Lab Results  Component Value Date   HGBA1C 5.3 04/17/2015            Assessment & Plan:   Eczema of Left Ear Canal:  eRx for Hydrocortisone drops BID prn  Fatigue, Constipation:  TSH and Free T4 today Likely side effect from Zoloft and depression that is not well controlled Advised her to follow up with her psychiatrist  RTC in 5 months for your annual exam Webb Silversmith, NP

## 2016-11-14 NOTE — Patient Instructions (Signed)

## 2016-11-25 ENCOUNTER — Ambulatory Visit
Admission: RE | Admit: 2016-11-25 | Discharge: 2016-11-25 | Disposition: A | Discharge status: Home / Self Care | Payer: BLUE CROSS/BLUE SHIELD | Source: Ambulatory Visit | Attending: Internal Medicine | Admitting: Internal Medicine

## 2016-11-25 DIAGNOSIS — Z1231 Encounter for screening mammogram for malignant neoplasm of breast: Secondary | ICD-10-CM | POA: Insufficient documentation

## 2017-02-04 ENCOUNTER — Telehealth: Payer: BLUE CROSS/BLUE SHIELD | Admitting: Family

## 2017-02-04 DIAGNOSIS — B9789 Other viral agents as the cause of diseases classified elsewhere: Secondary | ICD-10-CM

## 2017-02-04 DIAGNOSIS — J329 Chronic sinusitis, unspecified: Secondary | ICD-10-CM

## 2017-02-04 MED ORDER — FLUTICASONE PROPIONATE 50 MCG/ACT NA SUSP: 2.0000 | Freq: Every day | NASAL | 6 refills | Status: DC

## 2017-02-04 NOTE — Progress Notes (Signed)

## 2017-03-03 ENCOUNTER — Telehealth: Payer: BLUE CROSS/BLUE SHIELD | Admitting: Internal Medicine

## 2017-03-03 DIAGNOSIS — J208 Acute bronchitis due to other specified organisms: Secondary | ICD-10-CM

## 2017-03-03 MED ORDER — BENZONATATE 100 MG PO CAPS: 100.0000 mg | ORAL_CAPSULE | Freq: Three times a day (TID) | ORAL | 0 refills | Status: DC | PRN

## 2017-03-03 NOTE — Progress Notes (Signed)

## 2017-04-06 ENCOUNTER — Telehealth: Payer: BLUE CROSS/BLUE SHIELD | Admitting: Family

## 2017-04-06 DIAGNOSIS — J069 Acute upper respiratory infection, unspecified: Secondary | ICD-10-CM

## 2017-04-06 MED ORDER — BENZONATATE 100 MG PO CAPS: 100.0000 mg | ORAL_CAPSULE | Freq: Three times a day (TID) | ORAL | 0 refills | Status: DC | PRN

## 2017-04-06 MED ORDER — FLUTICASONE PROPIONATE 50 MCG/ACT NA SUSP: 2.0000 | Freq: Every day | NASAL | 6 refills | Status: DC

## 2017-04-06 NOTE — Progress Notes (Signed)

## 2017-04-14 ENCOUNTER — Encounter: Payer: BLUE CROSS/BLUE SHIELD | Admitting: Internal Medicine

## 2017-05-24 ENCOUNTER — Other Ambulatory Visit: Payer: Self-pay | Admitting: Internal Medicine

## 2017-06-02 ENCOUNTER — Encounter: Payer: Self-pay | Admitting: Internal Medicine

## 2017-06-02 ENCOUNTER — Ambulatory Visit (INDEPENDENT_AMBULATORY_CARE_PROVIDER_SITE_OTHER): Payer: BLUE CROSS/BLUE SHIELD | Admitting: Internal Medicine

## 2017-06-02 ENCOUNTER — Other Ambulatory Visit: Payer: Self-pay | Admitting: Internal Medicine

## 2017-06-02 ENCOUNTER — Other Ambulatory Visit (HOSPITAL_COMMUNITY)
Admission: RE | Admit: 2017-06-02 | Discharge: 2017-06-02 | Disposition: A | Discharge status: Home / Self Care | Payer: BLUE CROSS/BLUE SHIELD | Source: Ambulatory Visit | Attending: Family Medicine | Admitting: Family Medicine

## 2017-06-02 VITALS — BP 112/84 | HR 87 | Temp 97.0°F | Ht 65.5 in | Wt 201.4 lb

## 2017-06-02 DIAGNOSIS — F3177 Bipolar disorder, in partial remission, most recent episode mixed: Secondary | ICD-10-CM

## 2017-06-02 DIAGNOSIS — Z0000 Encounter for general adult medical examination without abnormal findings: Secondary | ICD-10-CM

## 2017-06-02 DIAGNOSIS — G43019 Migraine without aura, intractable, without status migrainosus: Secondary | ICD-10-CM

## 2017-06-02 DIAGNOSIS — E559 Vitamin D deficiency, unspecified: Secondary | ICD-10-CM

## 2017-06-02 DIAGNOSIS — F4310 Post-traumatic stress disorder, unspecified: Secondary | ICD-10-CM | POA: Diagnosis not present

## 2017-06-02 DIAGNOSIS — Z13220 Encounter for screening for lipoid disorders: Secondary | ICD-10-CM

## 2017-06-02 DIAGNOSIS — F5104 Psychophysiologic insomnia: Secondary | ICD-10-CM | POA: Diagnosis not present

## 2017-06-02 DIAGNOSIS — Z8619 Personal history of other infectious and parasitic diseases: Secondary | ICD-10-CM

## 2017-06-02 DIAGNOSIS — K219 Gastro-esophageal reflux disease without esophagitis: Secondary | ICD-10-CM | POA: Diagnosis not present

## 2017-06-02 DIAGNOSIS — E039 Hypothyroidism, unspecified: Secondary | ICD-10-CM | POA: Diagnosis not present

## 2017-06-02 LAB — COMPREHENSIVE METABOLIC PANEL
ALK PHOS: 88 U/L (ref 39–117)
ALT: 10 U/L (ref 0–35)
AST: 9 U/L (ref 0–37)
Albumin: 4 g/dL (ref 3.5–5.2)
BUN: 22 mg/dL (ref 6–23)
CO2: 28 mEq/L (ref 19–32)
Calcium: 9 mg/dL (ref 8.4–10.5)
Chloride: 104 mEq/L (ref 96–112)
Creatinine, Ser: 1 mg/dL (ref 0.40–1.20)
GFR: 62.88 mL/min (ref >60.00)
GLUCOSE: 94 mg/dL (ref 70–99)
Potassium: 3.7 mEq/L (ref 3.5–5.1)
Sodium: 139 mEq/L (ref 135–145)
TOTAL PROTEIN: 7.3 g/dL (ref 6.0–8.3)
Total Bilirubin: 0.4 mg/dL (ref 0.2–1.2)

## 2017-06-02 LAB — TSH: TSH: 0.14 u[IU]/mL — ABNORMAL LOW (ref 0.35–4.50)

## 2017-06-02 LAB — CBC
HEMATOCRIT: 43.8 % (ref 36.0–46.0)
HEMOGLOBIN: 14.5 g/dL (ref 12.0–15.0)
MCHC: 33.2 g/dL (ref 30.0–36.0)
MCV: 81.7 fl (ref 78.0–100.0)
Platelets: 240 10*3/uL (ref 150.0–400.0)
RBC: 5.36 Mil/uL — ABNORMAL HIGH (ref 3.87–5.11)
RDW: 15 % (ref 11.5–15.5)
WBC: 7.1 10*3/uL (ref 4.0–10.5)

## 2017-06-02 LAB — LIPID PANEL
CHOLESTEROL: 189 mg/dL (ref 0–200)
HDL: 43 mg/dL (ref >39.00)
LDL CALC: 134 mg/dL — AB (ref 0–99)
NonHDL: 145.81
Total CHOL/HDL Ratio: 4
Triglycerides: 60 mg/dL (ref 0.0–149.0)
VLDL: 12 mg/dL (ref 0.0–40.0)

## 2017-06-02 LAB — T4, FREE: FREE T4: 1.17 ng/dL (ref 0.60–1.60)

## 2017-06-02 LAB — VITAMIN D 25 HYDROXY (VIT D DEFICIENCY, FRACTURES): VITD: 13.08 ng/mL — ABNORMAL LOW (ref 30.00–100.00)

## 2017-06-02 MED ORDER — LEVOTHYROXINE SODIUM 125 MCG PO TABS: 125.0000 ug | ORAL_TABLET | Freq: Every day | ORAL | 1 refills | Status: DC

## 2017-06-02 MED ORDER — VITAMIN D (ERGOCALCIFEROL) 1.25 MG (50000 UNIT) PO CAPS: 50000.0000 [IU] | ORAL_CAPSULE | ORAL | 0 refills | Status: DC

## 2017-06-02 NOTE — Assessment & Plan Note (Signed)
Stable on Trazadone She will continue to follow with psych 

## 2017-06-02 NOTE — Patient Instructions (Signed)
Health Maintenance for Postmenopausal Women Menopause is a normal process in which your reproductive ability comes to an end. This process happens gradually over a span of months to years, usually between the ages of 22 and 9. Menopause is complete when you have missed 12 consecutive menstrual periods. It is important to talk with your health care provider about some of the most common conditions that affect postmenopausal women, such as heart disease, cancer, and bone loss (osteoporosis). Adopting a healthy lifestyle and getting preventive care can help to promote your health and wellness. Those actions can also lower your chances of developing some of these common conditions. What should I know about menopause? During menopause, you may experience a number of symptoms, such as:  Moderate-to-severe hot flashes.  Night sweats.  Decrease in sex drive.  Mood swings.  Headaches.  Tiredness.  Irritability.  Memory problems.  Insomnia.  Choosing to treat or not to treat menopausal changes is an individual decision that you make with your health care provider. What should I know about hormone replacement therapy and supplements? Hormone therapy products are effective for treating symptoms that are associated with menopause, such as hot flashes and night sweats. Hormone replacement carries certain risks, especially as you become older. If you are thinking about using estrogen or estrogen with progestin treatments, discuss the benefits and risks with your health care provider. What should I know about heart disease and stroke? Heart disease, heart attack, and stroke become more likely as you age. This may be due, in part, to the hormonal changes that your body experiences during menopause. These can affect how your body processes dietary fats, triglycerides, and cholesterol. Heart attack and stroke are both medical emergencies. There are many things that you can do to help prevent heart disease  and stroke:  Have your blood pressure checked at least every 1-2 years. High blood pressure causes heart disease and increases the risk of stroke.  If you are 53-22 years old, ask your health care provider if you should take aspirin to prevent a heart attack or a stroke.  Do not use any tobacco products, including cigarettes, chewing tobacco, or electronic cigarettes. If you need help quitting, ask your health care provider.  It is important to eat a healthy diet and maintain a healthy weight. ? Be sure to include plenty of vegetables, fruits, low-fat dairy products, and lean protein. ? Avoid eating foods that are high in solid fats, added sugars, or salt (sodium).  Get regular exercise. This is one of the most important things that you can do for your health. ? Try to exercise for at least 150 minutes each week. The type of exercise that you do should increase your heart rate and make you sweat. This is known as moderate-intensity exercise. ? Try to do strengthening exercises at least twice each week. Do these in addition to the moderate-intensity exercise.  Know your numbers.Ask your health care provider to check your cholesterol and your blood glucose. Continue to have your blood tested as directed by your health care provider.  What should I know about cancer screening? There are several types of cancer. Take the following steps to reduce your risk and to catch any cancer development as early as possible. Breast Cancer  Practice breast self-awareness. ? This means understanding how your breasts normally appear and feel. ? It also means doing regular breast self-exams. Let your health care provider know about any changes, no matter how small.  If you are 40  or older, have a clinician do a breast exam (clinical breast exam or CBE) every year. Depending on your age, family history, and medical history, it may be recommended that you also have a yearly breast X-ray (mammogram).  If you  have a family history of breast cancer, talk with your health care provider about genetic screening.  If you are at high risk for breast cancer, talk with your health care provider about having an MRI and a mammogram every year.  Breast cancer (BRCA) gene test is recommended for women who have family members with BRCA-related cancers. Results of the assessment will determine the need for genetic counseling and BRCA1 and for BRCA2 testing. BRCA-related cancers include these types: ? Breast. This occurs in males or females. ? Ovarian. ? Tubal. This may also be called fallopian tube cancer. ? Cancer of the abdominal or pelvic lining (peritoneal cancer). ? Prostate. ? Pancreatic.  Cervical, Uterine, and Ovarian Cancer Your health care provider may recommend that you be screened regularly for cancer of the pelvic organs. These include your ovaries, uterus, and vagina. This screening involves a pelvic exam, which includes checking for microscopic changes to the surface of your cervix (Pap test).  For women ages 21-65, health care providers may recommend a pelvic exam and a Pap test every three years. For women ages 79-65, they may recommend the Pap test and pelvic exam, combined with testing for human papilloma virus (HPV), every five years. Some types of HPV increase your risk of cervical cancer. Testing for HPV may also be done on women of any age who have unclear Pap test results.  Other health care providers may not recommend any screening for nonpregnant women who are considered low risk for pelvic cancer and have no symptoms. Ask your health care provider if a screening pelvic exam is right for you.  If you have had past treatment for cervical cancer or a condition that could lead to cancer, you need Pap tests and screening for cancer for at least 20 years after your treatment. If Pap tests have been discontinued for you, your risk factors (such as having a new sexual partner) need to be  reassessed to determine if you should start having screenings again. Some women have medical problems that increase the chance of getting cervical cancer. In these cases, your health care provider may recommend that you have screening and Pap tests more often.  If you have a family history of uterine cancer or ovarian cancer, talk with your health care provider about genetic screening.  If you have vaginal bleeding after reaching menopause, tell your health care provider.  There are currently no reliable tests available to screen for ovarian cancer.  Lung Cancer Lung cancer screening is recommended for adults 69-62 years old who are at high risk for lung cancer because of a history of smoking. A yearly low-dose CT scan of the lungs is recommended if you:  Currently smoke.  Have a history of at least 30 pack-years of smoking and you currently smoke or have quit within the past 15 years. A pack-year is smoking an average of one pack of cigarettes per day for one year.  Yearly screening should:  Continue until it has been 15 years since you quit.  Stop if you develop a health problem that would prevent you from having lung cancer treatment.  Colorectal Cancer  This type of cancer can be detected and can often be prevented.  Routine colorectal cancer screening usually begins at  age 42 and continues through age 45.  If you have risk factors for colon cancer, your health care provider may recommend that you be screened at an earlier age.  If you have a family history of colorectal cancer, talk with your health care provider about genetic screening.  Your health care provider may also recommend using home test kits to check for hidden blood in your stool.  A small camera at the end of a tube can be used to examine your colon directly (sigmoidoscopy or colonoscopy). This is done to check for the earliest forms of colorectal cancer.  Direct examination of the colon should be repeated every  5-10 years until age 71. However, if early forms of precancerous polyps or small growths are found or if you have a family history or genetic risk for colorectal cancer, you may need to be screened more often.  Skin Cancer  Check your skin from head to toe regularly.  Monitor any moles. Be sure to tell your health care provider: ? About any new moles or changes in moles, especially if there is a change in a mole's shape or color. ? If you have a mole that is larger than the size of a pencil eraser.  If any of your family members has a history of skin cancer, especially at a young age, talk with your health care provider about genetic screening.  Always use sunscreen. Apply sunscreen liberally and repeatedly throughout the day.  Whenever you are outside, protect yourself by wearing long sleeves, pants, a wide-brimmed hat, and sunglasses.  What should I know about osteoporosis? Osteoporosis is a condition in which bone destruction happens more quickly than new bone creation. After menopause, you may be at an increased risk for osteoporosis. To help prevent osteoporosis or the bone fractures that can happen because of osteoporosis, the following is recommended:  If you are 46-71 years old, get at least 1,000 mg of calcium and at least 600 mg of vitamin D per day.  If you are older than age 55 but younger than age 65, get at least 1,200 mg of calcium and at least 600 mg of vitamin D per day.  If you are older than age 54, get at least 1,200 mg of calcium and at least 800 mg of vitamin D per day.  Smoking and excessive alcohol intake increase the risk of osteoporosis. Eat foods that are rich in calcium and vitamin D, and do weight-bearing exercises several times each week as directed by your health care provider. What should I know about how menopause affects my mental health? Depression may occur at any age, but it is more common as you become older. Common symptoms of depression  include:  Low or sad mood.  Changes in sleep patterns.  Changes in appetite or eating patterns.  Feeling an overall lack of motivation or enjoyment of activities that you previously enjoyed.  Frequent crying spells.  Talk with your health care provider if you think that you are experiencing depression. What should I know about immunizations? It is important that you get and maintain your immunizations. These include:  Tetanus, diphtheria, and pertussis (Tdap) booster vaccine.  Influenza every year before the flu season begins.  Pneumonia vaccine.  Shingles vaccine.  Your health care provider may also recommend other immunizations. This information is not intended to replace advice given to you by your health care provider. Make sure you discuss any questions you have with your health care provider. Document Released: 06/14/2005  Document Revised: 11/10/2015 Document Reviewed: 01/24/2015 Elsevier Interactive Patient Education  2018 [employer]  

## 2017-06-02 NOTE — Assessment & Plan Note (Signed)
Chronic but stable on Imitrex and Advil Migraine Will monitor

## 2017-06-02 NOTE — Assessment & Plan Note (Signed)
Will check TSH and Free T4 today Will adjust Synthroid if needed based on labs 

## 2017-06-02 NOTE — Progress Notes (Signed)
Subjective:    Patient ID: Sandra Bass, female    DOB: November 08, 1970, 47 y.o.   MRN: [id number]  HPI  Pt presents to the clinic today for her annual exam. She is also due to follow up chronic conditions.  GERD: She is not sure what triggers this. She is taking Pantoprazole as prescribed. She denies breakthrough symptoms.   Migraines: These occur about 1-2 per week. She takes Imitrex and Advil Migraine as needed with good relief.   Hypothyroidism. She denies any issues on her current dose of Levothryoxine. Her levels were last checked 11/2016.  Anxiety/Bipolar Depression: Secondary to PTSD. She is taking Lamictal and Effexor as prescribed. She has Clonazepam but does not use this. She follows with Dr. Braulio Conte at [facility].  Insomnia: She has trouble falling asleep. She takes Trazadone at night.  History of Cold Sores: She treats with Acyclovir Ointment.  Flu: 01/2017, at work Tetanus: 03/2015 Pap Smear: 04/2016 Mammogram: 11/2016 Colon Screening: 05/2016, every 3 years Vision Screening: annually Dentist: as needed  Diet: She does eat meat. She consumes some fruits and veggies. She tries to avoid fried foods. She drinks mostly water, some unsweet tea. Exercise: She is doing burn boot camp, 2 x week  Review of Systems      Past Medical History:  Diagnosis Date  . Anal fissure   . Anxiety   . Bipolar disorder ([place])   . Depression    BIPOLAR  . GERD (gastroesophageal reflux disease)   . History of chicken pox   . Hypothyroidism   . Migraine   . UTI (lower urinary tract infection)     Current Outpatient Medications  Medication Sig Dispense Refill  . acetic acid-hydrocortisone (VOSOL-HC) OTIC solution Place 2 drops into the left ear 2 (two) times daily as needed. 10 mL 0  . acyclovir ointment (ZOVIRAX) 5 % Apply 1 application topically as needed.    . Ascorbic Acid (VITAMIN C GUMMIE PO) Take 1 tablet by mouth daily.    . benzonatate (TESSALON PERLES) 100 MG capsule Take 1  capsule (100 mg total) by mouth 3 (three) times daily as needed. 20 capsule 0  . clonazePAM (KLONOPIN) 0.5 MG tablet Take 0.5 mg by mouth 2 (two) times daily as needed for anxiety.    . Cyanocobalamin (VITAMIN B-12 PO) Take 1 tablet by mouth daily.    Marland Kitchen FIBER PO Take 2 capsules by mouth daily.     . fluticasone (FLONASE) 50 MCG/ACT nasal spray Place 2 sprays into both nostrils daily. 16 g 6  . LamoTRIgine 250 MG TB24 Take 1 tablet by mouth daily.  3  . levothyroxine (SYNTHROID, LEVOTHROID) 150 MCG tablet TAKE 1 TABLET (150 MCG TOTAL) BY MOUTH DAILY BEFORE BREAKFAST. 90 tablet 0  . nitrofurantoin (MACRODANTIN) 100 MG capsule TAKE 1 CAPSULE (100 MG TOTAL) BY MOUTH daily as needed after intercourse 30 capsule 5  . ondansetron (ZOFRAN-ODT) 8 MG disintegrating tablet Take 1 tablet (8 mg total) by mouth every 8 (eight) hours as needed for nausea or vomiting. 20 tablet 2  . pantoprazole (PROTONIX) 40 MG tablet TAKE 1 TABLET (40 MG TOTAL) BY MOUTH DAILY. 30 MINUTES BEFORE BREAKFAST 90 tablet 0  . Probiotic Product (PROBIOTIC DAILY PO) Take 1 tablet by mouth daily. At breakfast    . sertraline (ZOLOFT) 100 MG tablet Take 150 mg by mouth daily.  1  . SUMAtriptan (IMITREX) 100 MG tablet TAKE 1 TABLET BY MOUTH EVERY 2 HOURS AS NEEDED FOR MIGRAINE. MAY  REPEAT IN 2 HRS IF HEADACHE PERSIST 10 tablet 0  . temazepam (RESTORIL) 30 MG capsule Take 30 mg by mouth at bedtime as needed for sleep.     Current Facility-Administered Medications  Medication Dose Route Frequency Provider Last Rate Last Dose  . 0.9 %  sodium chloride infusion  500 mL Intravenous Continuous Milus Banister, MD        Allergies  Allergen Reactions  . Aspirin Other (See Comments)    Nose bleeds  . Hydrocodone Itching    Clawing of the skin    Family History  Problem Relation Age of Onset  . Arthritis Mother   . Glaucoma Father   . Stroke Maternal Grandmother   . Stroke Maternal Grandfather   . Tongue cancer Paternal Grandmother    . Alzheimer's disease Paternal Grandmother   . Heart disease Paternal Grandfather   . Glaucoma Paternal Grandfather   . Lung cancer Paternal Grandfather   . Brain cancer Paternal Grandfather     Social History   Socioeconomic History  . Marital status: Married    Spouse name: Not on file  . Number of children: 0  . Years of education: Not on file  . Highest education level: Not on file  Social Needs  . Financial resource strain: Not on file  . Food insecurity - worry: Not on file  . Food insecurity - inability: Not on file  . Transportation needs - medical: Not on file  . Transportation needs - non-medical: Not on file  Occupational History  . Occupation: bookkeeper  Tobacco Use  . Smoking status: Never Smoker  . Smokeless tobacco: Never Used  Substance and Sexual Activity  . Alcohol use: No    Alcohol/week: 0.0 oz  . Drug use: No  . Sexual activity: Yes    Partners: Male    Birth control/protection: Surgical    Comment: Husband  Other Topics Concern  . Not on file  Social History Narrative   Works at [employer], [place] in [place]   Lives with husband    Children- none   Pets: None    Caffeine- None, rare hot chocolate    Right handed    Enjoys baseball, taking an art class     Constitutional: Pt reports intermittent headaches. Denies fever, malaise, fatigue, or abrupt weight changes.  HEENT: Denies eye pain, eye redness, ear pain, ringing in the ears, wax buildup, runny nose, nasal congestion, bloody nose, or sore throat. Respiratory: Denies difficulty breathing, shortness of breath, cough or sputum production.   Cardiovascular: Denies chest pain, chest tightness, palpitations or swelling in the hands or feet.  Gastrointestinal: Pt reports intermittent constipation. Denies abdominal pain, bloating, diarrhea or blood in the stool.  GU: Denies urgency, frequency, pain with urination, burning sensation, blood in urine, odor or discharge. Musculoskeletal: Pt reports  intermittent knee pain. Denies decrease in range of motion, difficulty with gait, muscle pain or joint swelling.  Skin: Denies redness, rashes, lesions or ulcercations.  Neurological: Denies dizziness, difficulty with memory, difficulty with speech or problems with balance and coordination.  Psych: Pt has a history of anxiety and depression. Denies SI/HI.  No other specific complaints in a complete review of systems (except as listed in HPI above).  Objective:   Physical Exam  BP 112/84 (BP Location: Right Arm, Patient Position: Sitting, Cuff Size: Normal)   Pulse 87   Temp (!) 97 F (36.1 C) (Oral)   Ht 5' 5.5" (1.664 m)   Wt 201  lb 6.4 oz (91.4 kg)   LMP 05/06/2012   SpO2 97%   BMI 33.00 kg/m  Wt Readings from Last 3 Encounters:  06/02/17 201 lb 6.4 oz (91.4 kg)  11/13/16 215 lb (97.5 kg)  05/13/16 218 lb (98.9 kg)    General: Appears her stated age, obese in NAD. Skin: Warm, dry and intact. No rashes, lesions or ulcerations noted. HEENT: Head: normal shape and size; Eyes: sclera white, no icterus, conjunctiva pink, PERRLA and EOMs intact; Ears: Tm's gray and intact, normal light reflex; Throat/Mouth: Teeth present, mucosa pink and moist, no exudate, lesions or ulcerations noted.  Neck:  Neck supple, trachea midline. No masses, lumps present.  Cardiovascular: Normal rate and rhythm. S1,S2 noted.  No murmur, rubs or gallops noted. No JVD or BLE edema. No carotid bruits noted. Pulmonary/Chest: Normal effort and positive vesicular breath sounds. No respiratory distress. No wheezes, rales or ronchi noted.  Abdomen: Soft and nontender. Normal bowel sounds. No distention or masses noted. Liver, spleen and kidneys non palpable. Pelvic: Normal female anatomy. Cervix without changes. Adnexa non palpable. Musculoskeletal: Strength 5/5 BUE/BLE. No difficulty with gait.  Neurological: Alert and oriented. Cranial nerves II-XII grossly intact. Coordination normal.  Psychiatric: Mood and  affect normal. Behavior is normal. Judgment and thought content normal.     BMET    Component Value Date/Time   NA 139 04/08/2016 0845   NA 140 03/18/2014   NA 140 07/24/2012 0541   K 4.3 04/08/2016 0845   K 3.9 07/24/2012 0541   CL 104 04/08/2016 0845   CL 109 (H) 07/24/2012 0541   CO2 29 04/08/2016 0845   CO2 25 07/24/2012 0541   GLUCOSE 95 04/08/2016 0845   GLUCOSE 121 (H) 07/24/2012 0541   BUN 20 04/08/2016 0845   BUN 14 03/18/2014   BUN 10 07/24/2012 0541   CREATININE 1.06 04/08/2016 0845   CREATININE 1.13 07/24/2012 0541   CALCIUM 9.2 04/08/2016 0845   CALCIUM 8.0 (L) 07/24/2012 0541   GFRNONAA 59 (L) 07/24/2012 0541   GFRAA >60 07/24/2012 0541    Lipid Panel     Component Value Date/Time   CHOL 192 04/08/2016 0845   TRIG 72.0 04/08/2016 0845   HDL 44.40 04/08/2016 0845   CHOLHDL 4 04/08/2016 0845   VLDL 14.4 04/08/2016 0845   LDLCALC 134 (H) 04/08/2016 0845    CBC    Component Value Date/Time   WBC 7.4 04/08/2016 0845   RBC 5.30 (H) 04/08/2016 0845   HGB 13.7 04/08/2016 0845   HGB 11.4 (L) 07/24/2012 0541   HCT 42.1 04/08/2016 0845   HCT 34.1 (L) 07/24/2012 0541   PLT 274.0 04/08/2016 0845   PLT 228 07/24/2012 0541   MCV 79.4 04/08/2016 0845   MCV 79 (L) 07/24/2012 0541   MCH 26.4 07/24/2012 0541   MCHC 32.6 04/08/2016 0845   RDW 16.4 (H) 04/08/2016 0845   RDW 16.7 (H) 07/24/2012 0541   LYMPHSABS 2.6 02/19/2016 1040   MONOABS 0.4 02/19/2016 1040   EOSABS 0.1 02/19/2016 1040   BASOSABS 0.0 02/19/2016 1040    Hgb A1C Lab Results  Component Value Date   HGBA1C 5.3 04/17/2015            Assessment & Plan:   Preventative Health Maintenance:  Flu and tetanus UTD She will schedule her mammogram for 11/2017 Pap smear today, she declines STD screening Encouraged her to consume a balanced diet and exercise regimen Advised her to see an eye doctor and dentist  annually Will check CBC, CMET , Lipid, TSH, Free T4, Lipid and Vit D  today  RTC in 1 year, sooner if needed .emcred

## 2017-06-02 NOTE — Assessment & Plan Note (Signed)
Chronic but stable on Effexor and Lamictal She will continue to follow with psych

## 2017-06-02 NOTE — Addendum Note (Signed)
Addended by: Lurlean Nanny on: 06/02/2017 12:05 PM   Modules accepted: Orders

## 2017-06-02 NOTE — Assessment & Plan Note (Signed)
Continue Acyclovir Ointment

## 2017-06-02 NOTE — Assessment & Plan Note (Signed)
Chronic but stable on Pantoprazole She is not interested in weaning medication at this time CBC and CMET today

## 2017-06-02 NOTE — Addendum Note (Signed)
Addended by: Jearld Fenton on: 06/02/2017 12:11 PM   Modules accepted: Orders

## 2017-06-03 LAB — CYTOLOGY - PAP
Adequacy: ABSENT
DIAGNOSIS: NEGATIVE

## 2017-06-05 NOTE — Addendum Note (Signed)
Addended by: Lurlean Nanny on: 06/05/2017 02:26 PM   Modules accepted: Orders

## 2017-06-25 ENCOUNTER — Other Ambulatory Visit: Payer: Self-pay | Admitting: Internal Medicine

## 2017-06-26 NOTE — Telephone Encounter (Signed)
Last filled 05/26/17... Please advise

## 2017-07-07 ENCOUNTER — Other Ambulatory Visit: Payer: BLUE CROSS/BLUE SHIELD

## 2017-07-21 ENCOUNTER — Other Ambulatory Visit: Payer: BLUE CROSS/BLUE SHIELD

## 2017-07-25 ENCOUNTER — Other Ambulatory Visit: Payer: BLUE CROSS/BLUE SHIELD

## 2017-07-25 ENCOUNTER — Other Ambulatory Visit (INDEPENDENT_AMBULATORY_CARE_PROVIDER_SITE_OTHER): Payer: BLUE CROSS/BLUE SHIELD

## 2017-07-25 DIAGNOSIS — E039 Hypothyroidism, unspecified: Secondary | ICD-10-CM | POA: Diagnosis not present

## 2017-07-25 LAB — TSH: TSH: 1.31 mIU/L

## 2017-07-25 NOTE — Addendum Note (Signed)
Addended by: Ellamae Sia on: 07/25/2017 03:41 PM   Modules accepted: Orders

## 2017-07-30 ENCOUNTER — Emergency Department: Payer: BLUE CROSS/BLUE SHIELD

## 2017-07-30 ENCOUNTER — Ambulatory Visit: Payer: Self-pay | Admitting: *Deleted

## 2017-07-30 ENCOUNTER — Emergency Department
Admission: EM | Admit: 2017-07-30 | Discharge: 2017-07-30 | Disposition: A | Discharge status: Home / Self Care | Payer: BLUE CROSS/BLUE SHIELD | Attending: Emergency Medicine | Admitting: Emergency Medicine

## 2017-07-30 ENCOUNTER — Other Ambulatory Visit: Payer: Self-pay

## 2017-07-30 DIAGNOSIS — R102 Pelvic and perineal pain: Secondary | ICD-10-CM

## 2017-07-30 DIAGNOSIS — R109 Unspecified abdominal pain: Secondary | ICD-10-CM

## 2017-07-30 DIAGNOSIS — E039 Hypothyroidism, unspecified: Secondary | ICD-10-CM | POA: Diagnosis not present

## 2017-07-30 DIAGNOSIS — R1031 Right lower quadrant pain: Secondary | ICD-10-CM | POA: Insufficient documentation

## 2017-07-30 LAB — CBC WITH DIFFERENTIAL/PLATELET
Basophils Absolute: 0.1 10*3/uL (ref 0–0.1)
Basophils Relative: 1 %
EOS PCT: 2 %
Eosinophils Absolute: 0.1 10*3/uL (ref 0–0.7)
HEMATOCRIT: 40.4 % (ref 35.0–47.0)
Hemoglobin: 13.3 g/dL (ref 12.0–16.0)
LYMPHS ABS: 2.1 10*3/uL (ref 1.0–3.6)
Lymphocytes Relative: 35 %
MCH: 27 pg (ref 26.0–34.0)
MCHC: 33 g/dL (ref 32.0–36.0)
MCV: 81.8 fL (ref 80.0–100.0)
MONO ABS: 0.4 10*3/uL (ref 0.2–0.9)
MONOS PCT: 7 %
NEUTROS ABS: 3.4 10*3/uL (ref 1.4–6.5)
Neutrophils Relative %: 55 %
PLATELETS: 238 10*3/uL (ref 150–440)
RBC: 4.94 MIL/uL (ref 3.80–5.20)
RDW: 15.7 % — ABNORMAL HIGH (ref 11.5–14.5)
WBC: 6 10*3/uL (ref 3.6–11.0)

## 2017-07-30 LAB — URINALYSIS, COMPLETE (UACMP) WITH MICROSCOPIC
Bilirubin Urine: NEGATIVE
GLUCOSE, UA: NEGATIVE mg/dL
Hgb urine dipstick: NEGATIVE
Ketones, ur: NEGATIVE mg/dL
Leukocytes, UA: NEGATIVE
Nitrite: NEGATIVE
PH: 5 (ref 5.0–8.0)
Protein, ur: NEGATIVE mg/dL
SPECIFIC GRAVITY, URINE: 1.013 (ref 1.005–1.030)

## 2017-07-30 LAB — COMPREHENSIVE METABOLIC PANEL
ALT: 20 U/L (ref 14–54)
AST: 32 U/L (ref 15–41)
Albumin: 3.9 g/dL (ref 3.5–5.0)
Alkaline Phosphatase: 75 U/L (ref 38–126)
Anion gap: 9 (ref 5–15)
BILIRUBIN TOTAL: 0.7 mg/dL (ref 0.3–1.2)
BUN: 23 mg/dL — AB (ref 6–20)
CO2: 25 mmol/L (ref 22–32)
Calcium: 8.9 mg/dL (ref 8.9–10.3)
Chloride: 106 mmol/L (ref 101–111)
Creatinine, Ser: 0.96 mg/dL (ref 0.44–1.00)
Glucose, Bld: 94 mg/dL (ref 65–99)
Potassium: 4.2 mmol/L (ref 3.5–5.1)
Sodium: 140 mmol/L (ref 135–145)
TOTAL PROTEIN: 7.3 g/dL (ref 6.5–8.1)

## 2017-07-30 LAB — LIPASE, BLOOD: LIPASE: 37 U/L (ref 11–51)

## 2017-07-30 MED ORDER — POLYETHYLENE GLYCOL 3350 17 GM/SCOOP PO POWD: 1.0000 | Freq: Once | ORAL | 0 refills | Status: AC

## 2017-07-30 MED ORDER — MORPHINE SULFATE (PF) 4 MG/ML IV SOLN
4.0000 mg | Freq: Once | INTRAVENOUS | Status: AC
  Administered 2017-07-30: 4 mg via INTRAVENOUS
  Filled 2017-07-30: qty 1

## 2017-07-30 NOTE — Telephone Encounter (Signed)
Per chart review tab pt is at [facility] ED. 

## 2017-07-30 NOTE — Telephone Encounter (Signed)
Pt called with severe abd pain that started yesterday in her right lower abd and radiated to her right back. She had been taking ibuprofen around the clock and it did help her back but now this last ibuprofen is not touching her pain. She denies fever, nausea or vomiting.  She had diarrhea on Monday but none since.  Advised her to go to the closest emergency department [facility]) to be assessed, according to protocol. Pt advised to not eat or drink anything else until seen in the ED. Pt voiced understanding. Will let flow at [facility] know.  Reason for Disposition . [1] SEVERE pain (e.g., excruciating) AND [2] present > 1 hour  Answer Assessment - Initial Assessment Questions 1. LOCATION: "Where does it hurt?"      Lower right abd pain and radiates to the back 2. RADIATION: "Does the pain shoot anywhere else?" (e.g., chest, back)     Radiates to the back 3. ONSET: "When did the pain begin?" (e.g., minutes, hours or days ago)      yesterday 4. SUDDEN: "Gradual or sudden onset?"     Sudden onset 5. PATTERN "Does the pain come and go, or is it constant?"    - If constant: "Is it getting better, staying the same, or worsening?"      (Note: Constant means the pain never goes away completely; most serious pain is constant and it progresses)     - If intermittent: "How long does it last?" "Do you have pain now?"     (Note: Intermittent means the pain goes away completely between bouts)     constant 6. SEVERITY: "How bad is the pain?"  (e.g., Scale 1-10; mild, moderate, or severe)   - MILD (1-3): doesn't interfere with normal activities, abdomen soft and not tender to touch    - MODERATE (4-7): interferes with normal activities or awakens from sleep, tender to touch    - SEVERE (8-10): excruciating pain, doubled over, unable to do any normal activities      # 7 and 8 7. RECURRENT SYMPTOM: "Have you ever had this type of abdominal pain before?" If so, ask: "When was the last time?" and "What  happened that time?"      no 8. CAUSE: "What do you think is causing the abdominal pain?"     no 9. RELIEVING/AGGRAVATING FACTORS: "What makes it better or worse?" (e.g., movement, antacids, bowel movement)     no 10. OTHER SYMPTOMS: "Has there been any vomiting, diarrhea, constipation, or urine problems?"       none 11. PREGNANCY: "Is there any chance you are pregnant?" "When was your last menstrual period?"       No, partial hysterectomy  Protocols used: ABDOMINAL PAIN - [facility]

## 2017-07-30 NOTE — ED Provider Notes (Signed)
[facility] Emergency Department Provider Note       Time seen: ----------------------------------------- 10:06 AM on 07/30/2017 -----------------------------------------   I have reviewed the triage vital signs and the nursing notes.  HISTORY   Chief Complaint Abdominal Pain    HPI Sandra Bass is a 47 y.o. female with a history of interfissural, anxiety, bipolar disorder, GERD and migraines who presents to the ED for right lower quadrant pain.  Doctor sent her here for to be evaluated.  Patient complains of pain only in the right lower quadrant, denies fevers, chills, chest pain, shortness of breath, has had some nausea but no diarrhea.  She is never had pain like this before and no rash.  Past Medical History:  Diagnosis Date  . Anal fissure   . Anxiety   . Bipolar disorder ([place])   . Depression    BIPOLAR  . GERD (gastroesophageal reflux disease)   . History of chicken pox   . Hypothyroidism   . Migraine   . UTI (lower urinary tract infection)     Patient Active Problem List   Diagnosis Date Noted  . H/O cold sores 10/09/2015  . Insomnia 10/09/2015  . Hypothyroidism 10/09/2015  . PTSD (post-traumatic stress disorder) 11/10/2014  . Migraines 09/24/2014  . GERD (gastroesophageal reflux disease) 09/23/2014  . Bipolar disorder ([place]) 09/23/2014    Past Surgical History:  Procedure Laterality Date  .  cyst Right 2011   Benign cyst removed R great toe  . ABDOMINAL HYSTERECTOMY  2014  . CHOLECYSTECTOMY  2010  . TUBAL LIGATION  2007    Allergies Aspirin and Hydrocodone  Social History Social History   Tobacco Use  . Smoking status: Never Smoker  . Smokeless tobacco: Never Used  Substance Use Topics  . Alcohol use: No    Alcohol/week: 0.0 oz  . Drug use: No   Review of Systems Constitutional: Negative for fever. Cardiovascular: Negative for chest pain. Respiratory: Negative for shortness of breath. Gastrointestinal: Positive  for abdominal pain, nausea Genitourinary: Negative for dysuria. Musculoskeletal: Negative for back pain. Skin: Negative for rash. Neurological: Negative for headaches, focal weakness or numbness.  All systems negative/normal/unremarkable except as stated in the HPI  ____________________________________________   PHYSICAL EXAM:  VITAL SIGNS: ED Triage Vitals [07/30/17 0928]  Enc Vitals Group     BP (!) 141/91     Pulse Rate 81     Resp 18     Temp 97.9 F (36.6 C)     Temp Source Oral     SpO2 100 %     Weight 205 lb (93 kg)     Height 5\' 5"  (1.651 m)     Head Circumference      Peak Flow      Pain Score 8     Pain Loc      Pain Edu?      Excl. in [place]?    Constitutional: Alert and oriented. Well appearing and in no distress. Eyes: Conjunctivae are normal. Normal extraocular movements. ENT   Head: Normocephalic and atraumatic.   Nose: No congestion/rhinnorhea.   Mouth/Throat: Mucous membranes are moist.   Neck: No stridor. Cardiovascular: Normal rate, regular rhythm. No murmurs, rubs, or gallops. Respiratory: Normal respiratory effort without tachypnea nor retractions. Breath sounds are clear and equal bilaterally. No wheezes/rales/rhonchi. Gastrointestinal: Right flank tenderness, no rebound or guarding.  Normal bowel sounds. Musculoskeletal: Nontender with normal range of motion in extremities. No lower extremity tenderness nor edema. Neurologic:  Normal speech and language. No gross focal neurologic deficits are appreciated.  Skin:  Skin is warm, dry and intact. No rash noted. Psychiatric: Mood and affect are normal. Speech and behavior are normal.  ____________________________________________  ED COURSE:  As part of my medical decision making, I reviewed the following data within the [place] History obtained from family if available, nursing notes, old chart and ekg, as well as notes from prior ED visits. Patient presented for right  flank pain, we will assess with labs and imaging as indicated at this time.   Procedures ____________________________________________   LABS (pertinent positives/negatives)  Labs Reviewed  CBC WITH DIFFERENTIAL/PLATELET - Abnormal; Notable for the following components:      Result Value   RDW 15.7 (*)    All other components within normal limits  COMPREHENSIVE METABOLIC PANEL - Abnormal; Notable for the following components:   BUN 23 (*)    All other components within normal limits  LIPASE, BLOOD  URINALYSIS, COMPLETE (UACMP) WITH MICROSCOPIC    RADIOLOGY Images were viewed by me  CT renal protocol is negative Pelvic ultrasound is negative  ____________________________________________  DIFFERENTIAL DIAGNOSIS   Constipation, renal colic, appendicitis, UTI  FINAL ASSESSMENT AND PLAN  Flank pain   Plan: The patient had presented for flank pain which is likely related to gas or constipation. Patient's labs were reassuring. Patient's imaging was also reassuring.  She is cleared for outpatient follow-up with [facility].   Laurence Aly, MD   Note: This note was generated in part or whole with voice recognition software. Voice recognition is usually quite accurate but there are transcription errors that can and very often do occur. I apologize for any typographical errors that were not detected and corrected.     Earleen Newport, MD 07/30/17 1248

## 2017-07-30 NOTE — Telephone Encounter (Signed)
noted 

## 2017-07-30 NOTE — ED Triage Notes (Signed)
Pt reports that she is having RLQ pain, states that MD sent her here to be evaluated.

## 2017-07-31 ENCOUNTER — Other Ambulatory Visit: Payer: Self-pay | Admitting: Internal Medicine

## 2017-07-31 LAB — URINE CULTURE
CULTURE: NO GROWTH
Special Requests: NORMAL

## 2017-08-24 ENCOUNTER — Other Ambulatory Visit: Payer: Self-pay | Admitting: Internal Medicine

## 2017-08-26 ENCOUNTER — Other Ambulatory Visit: Payer: Self-pay | Admitting: Internal Medicine

## 2017-09-02 ENCOUNTER — Telehealth: Payer: BLUE CROSS/BLUE SHIELD | Admitting: Family

## 2017-09-02 DIAGNOSIS — R059 Cough, unspecified: Secondary | ICD-10-CM

## 2017-09-02 DIAGNOSIS — J40 Bronchitis, not specified as acute or chronic: Secondary | ICD-10-CM

## 2017-09-02 DIAGNOSIS — R05 Cough: Secondary | ICD-10-CM

## 2017-09-02 MED ORDER — PREDNISONE 10 MG (21) PO TBPK: ORAL_TABLET | ORAL | 0 refills | Status: DC

## 2017-09-02 NOTE — Progress Notes (Signed)

## 2017-09-05 ENCOUNTER — Other Ambulatory Visit: Payer: Self-pay | Admitting: Internal Medicine

## 2017-09-08 ENCOUNTER — Other Ambulatory Visit: Payer: BLUE CROSS/BLUE SHIELD

## 2017-09-08 NOTE — Telephone Encounter (Signed)
zofran last filled 10/2015 looks like it was prescribed for migraine Sx, Imitrex last filled 06/2017... macrobid last filled 04/2016... Please advise

## 2017-09-15 ENCOUNTER — Other Ambulatory Visit: Payer: BLUE CROSS/BLUE SHIELD

## 2017-09-22 ENCOUNTER — Other Ambulatory Visit: Payer: Self-pay | Admitting: Internal Medicine

## 2017-09-22 DIAGNOSIS — Z1231 Encounter for screening mammogram for malignant neoplasm of breast: Secondary | ICD-10-CM

## 2017-09-27 ENCOUNTER — Other Ambulatory Visit: Payer: Self-pay | Admitting: Internal Medicine

## 2017-11-21 ENCOUNTER — Other Ambulatory Visit: Payer: Self-pay | Admitting: Internal Medicine

## 2017-11-22 ENCOUNTER — Other Ambulatory Visit: Payer: Self-pay | Admitting: Internal Medicine

## 2017-12-15 ENCOUNTER — Encounter: Payer: Self-pay | Admitting: Internal Medicine

## 2017-12-22 ENCOUNTER — Encounter: Payer: Self-pay | Admitting: Internal Medicine

## 2017-12-22 ENCOUNTER — Ambulatory Visit: Payer: BLUE CROSS/BLUE SHIELD | Admitting: Internal Medicine

## 2017-12-22 VITALS — BP 122/84 | HR 96 | Temp 98.1°F | Wt 210.0 lb

## 2017-12-22 DIAGNOSIS — L299 Pruritus, unspecified: Secondary | ICD-10-CM

## 2017-12-22 DIAGNOSIS — T148XXA Other injury of unspecified body region, initial encounter: Secondary | ICD-10-CM

## 2017-12-22 LAB — CBC
HCT: 39.7 % (ref 36.0–46.0)
HEMOGLOBIN: 12.4 g/dL (ref 12.0–15.0)
MCHC: 31.2 g/dL (ref 30.0–36.0)
MCV: 73.6 fl — AB (ref 78.0–100.0)
PLATELETS: 298 10*3/uL (ref 150.0–400.0)
RBC: 5.4 Mil/uL — AB (ref 3.87–5.11)
RDW: 17.1 % — ABNORMAL HIGH (ref 11.5–15.5)
WBC: 8.7 10*3/uL (ref 4.0–10.5)

## 2017-12-22 LAB — COMPREHENSIVE METABOLIC PANEL WITH GFR
ALT: 11 U/L (ref 0–35)
AST: 10 U/L (ref 0–37)
Albumin: 4.1 g/dL (ref 3.5–5.2)
Alkaline Phosphatase: 86 U/L (ref 39–117)
BUN: 20 mg/dL (ref 6–23)
CO2: 30 meq/L (ref 19–32)
Calcium: 9.7 mg/dL (ref 8.4–10.5)
Chloride: 103 meq/L (ref 96–112)
Creatinine, Ser: 1.2 mg/dL (ref 0.40–1.20)
GFR: 50.83 mL/min — ABNORMAL LOW
Glucose, Bld: 89 mg/dL (ref 70–99)
Potassium: 4 meq/L (ref 3.5–5.1)
Sodium: 140 meq/L (ref 135–145)
Total Bilirubin: 0.5 mg/dL (ref 0.2–1.2)
Total Protein: 7.3 g/dL (ref 6.0–8.3)

## 2017-12-22 NOTE — Progress Notes (Signed)
Subjective:    Patient ID: Sandra Bass, female    DOB: August 30, 1970, 47 y.o.   MRN: [id number]  HPI  Pt presents to the clinic today with c/o itching of her left upper thigh. She reports this started 6-8 weeks ago. There has never been any rash there, but she reports she bruises badly in that area after scratching. She has not noticed any rash in the area. She reports she has had shingles in the same area 3 different times but has not had this recently. She is not bruising anywhere else. She has not tried anything OTC for this.  Review of Systems  Past Medical History:  Diagnosis Date  . Anal fissure   . Anxiety   . Bipolar disorder ([place])   . Depression    BIPOLAR  . GERD (gastroesophageal reflux disease)   . History of chicken pox   . Hypothyroidism   . Migraine   . UTI (lower urinary tract infection)     Current Outpatient Medications  Medication Sig Dispense Refill  . acetic acid-hydrocortisone (VOSOL-HC) OTIC solution Place 2 drops into the left ear 2 (two) times daily as needed. 10 mL 0  . acyclovir ointment (ZOVIRAX) 5 % Apply 1 application topically as needed.    . Ascorbic Acid (VITAMIN C GUMMIE PO) Take 1 tablet by mouth daily.    . Cyanocobalamin (VITAMIN B-12 PO) Take 1 tablet by mouth daily.    Marland Kitchen FIBER PO Take 2 capsules by mouth daily.     . LamoTRIgine 100 MG TB24 24 hour tablet TAKE 2 TABLETS BY MOUTH EVERY AM AND 1 TABLET AT BEDTIME  1  . levothyroxine (SYNTHROID, LEVOTHROID) 125 MCG tablet TAKE 1 TABLET BY MOUTH EVERY DAY 30 tablet 8  . nitrofurantoin (MACRODANTIN) 100 MG capsule TAKE 1 CAPSULE (100 MG TOTAL) BY MOUTH DAILY AS NEEDED AFTER INTERCOURSE 30 capsule 3  . ondansetron (ZOFRAN-ODT) 8 MG disintegrating tablet TAKE 1 TABLET (8 MG TOTAL) BY MOUTH EVERY 8 (EIGHT) HOURS AS NEEDED FOR NAUSEA OR VOMITING. 20 tablet 1  . pantoprazole (PROTONIX) 40 MG tablet TAKE 1 TABLET (40 MG TOTAL) BY MOUTH DAILY 30 MINUTES BEFORE BREAKFAST 90 tablet 0  . Probiotic Product  (PROBIOTIC DAILY PO) Take 1 tablet by mouth daily. At breakfast    . SUMAtriptan (IMITREX) 100 MG tablet TAKE 1 TABLET BY MOUTH EVERY 2 HOURS AS NEEDED FOR MIGRAINE. MAY REPEAT IN 2 HRS IF HEADACHE PERSIST 10 tablet 1  . traZODone (DESYREL) 100 MG tablet Take 1.5 tablets by mouth at bedtime.  1  . venlafaxine XR (EFFEXOR-XR) 75 MG 24 hr capsule Take 1 capsule by mouth every morning.  1   No current facility-administered medications for this visit.     Allergies  Allergen Reactions  . Aspirin Other (See Comments)    Nose bleeds  . Hydrocodone Itching    Clawing of the skin    Family History  Problem Relation Age of Onset  . Arthritis Mother   . Glaucoma Father   . Stroke Maternal Grandmother   . Stroke Maternal Grandfather   . Tongue cancer Paternal Grandmother   . Alzheimer's disease Paternal Grandmother   . Heart disease Paternal Grandfather   . Glaucoma Paternal Grandfather   . Lung cancer Paternal Grandfather   . Brain cancer Paternal Grandfather     Social History   Socioeconomic History  . Marital status: Married    Spouse name: Not on file  . Number of children:  0  . Years of education: Not on file  . Highest education level: Not on file  Occupational History  . Occupation: bookkeeper  Scientific laboratory technician  . Financial resource strain: Not on file  . Food insecurity:    Worry: Not on file    Inability: Not on file  . Transportation needs:    Medical: Not on file    Non-medical: Not on file  Tobacco Use  . Smoking status: Never Smoker  . Smokeless tobacco: Never Used  Substance and Sexual Activity  . Alcohol use: No    Alcohol/week: 0.0 standard drinks  . Drug use: No  . Sexual activity: Yes    Partners: Male    Birth control/protection: Surgical    Comment: Husband  Lifestyle  . Physical activity:    Days per week: Not on file    Minutes per session: Not on file  . Stress: Not on file  Relationships  . Social connections:    Talks on phone: Not on file      Gets together: Not on file    Attends religious service: Not on file    Active member of club or organization: Not on file    Attends meetings of clubs or organizations: Not on file    Relationship status: Not on file  . Intimate partner violence:    Fear of current or ex partner: Not on file    Emotionally abused: Not on file    Physically abused: Not on file    Forced sexual activity: Not on file  Other Topics Concern  . Not on file  Social History Narrative   Works at [employer], [place] in [place]   Lives with husband    Children- none   Pets: None    Caffeine- None, rare hot chocolate    Right handed    Enjoys baseball, taking an art class     Constitutional: Denies fever, malaise, fatigue, headache or abrupt weight changes.  Skin: Pt reports itching and bruising of left upper thigh. Denies rashes, lesions or ulcercations.    No other specific complaints in a complete review of systems (except as listed in HPI above).     Objective:   Physical Exam   BP 122/84   Pulse 96   Temp 98.1 F (36.7 C) (Oral)   Wt 210 lb (95.3 kg)   LMP 05/06/2012   SpO2 97%   BMI 34.95 kg/m  Wt Readings from Last 3 Encounters:  12/22/17 210 lb (95.3 kg)  07/30/17 205 lb (93 kg)  06/02/17 201 lb 6.4 oz (91.4 kg)    General: Appears her stated age, obese in NAD. Skin: Excoriation and multiple small bruises of left upper thigh.    BMET    Component Value Date/Time   NA 140 07/30/2017 1034   NA 140 03/18/2014   NA 140 07/24/2012 0541   K 4.2 07/30/2017 1034   K 3.9 07/24/2012 0541   CL 106 07/30/2017 1034   CL 109 (H) 07/24/2012 0541   CO2 25 07/30/2017 1034   CO2 25 07/24/2012 0541   GLUCOSE 94 07/30/2017 1034   GLUCOSE 121 (H) 07/24/2012 0541   BUN 23 (H) 07/30/2017 1034   BUN 14 03/18/2014   BUN 10 07/24/2012 0541   CREATININE 0.96 07/30/2017 1034   CREATININE 1.13 07/24/2012 0541   CALCIUM 8.9 07/30/2017 1034   CALCIUM 8.0 (L) 07/24/2012 0541   GFRNONAA >60  07/30/2017 1034   GFRNONAA 59 (L) 07/24/2012  0541   GFRAA >60 07/30/2017 1034   GFRAA >60 07/24/2012 0541    Lipid Panel     Component Value Date/Time   CHOL 189 06/02/2017 0907   TRIG 60.0 06/02/2017 0907   HDL 43.00 06/02/2017 0907   CHOLHDL 4 06/02/2017 0907   VLDL 12.0 06/02/2017 0907   LDLCALC 134 (H) 06/02/2017 0907    CBC    Component Value Date/Time   WBC 6.0 07/30/2017 1034   RBC 4.94 07/30/2017 1034   HGB 13.3 07/30/2017 1034   HGB 11.4 (L) 07/24/2012 0541   HCT 40.4 07/30/2017 1034   HCT 34.1 (L) 07/24/2012 0541   PLT 238 07/30/2017 1034   PLT 228 07/24/2012 0541   MCV 81.8 07/30/2017 1034   MCV 79 (L) 07/24/2012 0541   MCH 27.0 07/30/2017 1034   MCHC 33.0 07/30/2017 1034   RDW 15.7 (H) 07/30/2017 1034   RDW 16.7 (H) 07/24/2012 0541   LYMPHSABS 2.1 07/30/2017 1034   MONOABS 0.4 07/30/2017 1034   EOSABS 0.1 07/30/2017 1034   BASOSABS 0.1 07/30/2017 1034    Hgb A1C Lab Results  Component Value Date   HGBA1C 5.3 04/17/2015           Assessment & Plan:   Pruritis, Bruising:  I think the bruising is coming from the trauma of scratching ? Allergic issue vs PHN Will trial Allegra daily x 2 weeks If no improvement, consider trial of Neurontin CMET and CBC today for further eval  Will follow up after labs, return precautions discussed Webb Silversmith, NP

## 2017-12-22 NOTE — Patient Instructions (Signed)
Pruritus  Pruritus is an itching feeling. There are many different conditions and factors that can make your skin itchy. Dry skin is one of the most common causes of itching. Most cases of itching do not require medical attention. Itchy skin can turn into a rash.  Follow these instructions at home:  Watch your pruritus for any changes. Take these steps to help with your condition:  Skin Care  · Moisturize your skin as needed. A moisturizer that contains petroleum jelly is best for keeping moisture in your skin.  · Take or apply medicines only as directed by your health care provider. This may include:  ? Corticosteroid cream.  ? Anti-itch lotions.  ? Oral anti-histamines.  · Apply cool compresses to the affected areas.  · Try taking a bath with:  ? Epsom salts. Follow the instructions on the packaging. You can get these at your local pharmacy or grocery store.  ? Baking soda. Pour a small amount into the bath as directed by your health care provider.  ? Colloidal oatmeal. Follow the instructions on the packaging. You can get this at your local pharmacy or grocery store.  · Try applying baking soda paste to your skin. Stir water into baking soda until it reaches a paste-like consistency.  · Do not scratch your skin.  · Avoid hot showers or baths, which can make itching worse. A cold shower may help with itching as long as you use a moisturizer after.  · Avoid scented soaps, detergents, and perfumes. Use gentle soaps, detergents, perfumes, and other cosmetic products.  General instructions  · Avoid wearing tight clothes.  · Keep a journal to help track what causes your itch. Write down:  ? What you eat.  ? What cosmetic products you use.  ? What you drink.  ? What you wear. This includes jewelry.  · Use a humidifier. This keeps the air moist, which helps to prevent dry skin.  Contact a health care provider if:  · The itching does not go away after several days.  · You sweat at night.  · You have weight loss.  · You  are unusually thirsty.  · You urinate more than normal.  · You are more tired than normal.  · You have abdominal pain.  · Your skin tingles.  · You feel weak.  · Your skin or the whites of your eyes look yellow (jaundice).  · Your skin feels numb.  This information is not intended to replace advice given to you by your health care provider. Make sure you discuss any questions you have with your health care provider.  Document Released: 01/02/2011 Document Revised: 09/28/2015 Document Reviewed: 04/18/2014  Elsevier Interactive Patient Education © 2018 [employer]vier Inc[employer]

## 2018-01-12 ENCOUNTER — Ambulatory Visit
Admission: RE | Admit: 2018-01-12 | Discharge: 2018-01-12 | Disposition: A | Discharge status: Home / Self Care | Payer: BLUE CROSS/BLUE SHIELD | Source: Ambulatory Visit | Attending: Internal Medicine | Admitting: Internal Medicine

## 2018-01-12 DIAGNOSIS — Z1231 Encounter for screening mammogram for malignant neoplasm of breast: Secondary | ICD-10-CM | POA: Diagnosis not present

## 2018-02-22 ENCOUNTER — Other Ambulatory Visit: Payer: Self-pay | Admitting: Internal Medicine

## 2018-04-04 IMAGING — CT CT RENAL STONE PROTOCOL
2 of 4 series · 16 of 46 positions shown, 18 images · non-contrast
Comparison: None.

CLINICAL DATA: Acute right flank pain.

EXAM:
CT ABDOMEN AND PELVIS WITHOUT CONTRAST
TECHNIQUE: Multidetector CT imaging of the abdomen and pelvis was performed
following the standard protocol without IV contrast.

[Series 3: stone full standard · axial · 0.64mm/px · z∈[-1124,-698]mm · 13 of 93 slices shown, 15 images]
[im 4/93  soft-tissue]
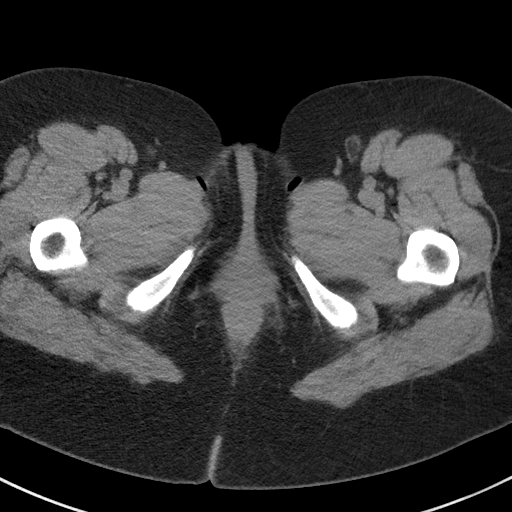
[im 4/93  bone]
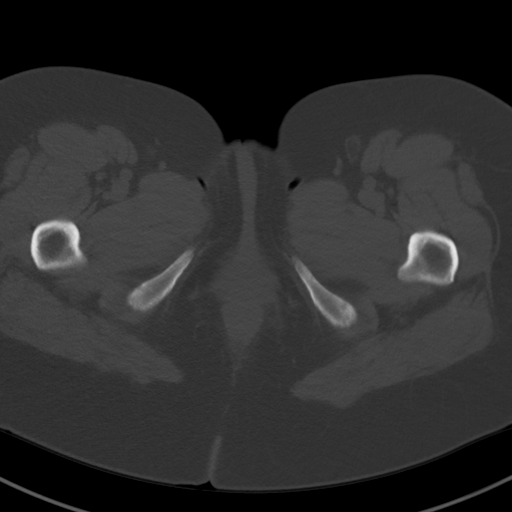
[im 12/93  soft-tissue]
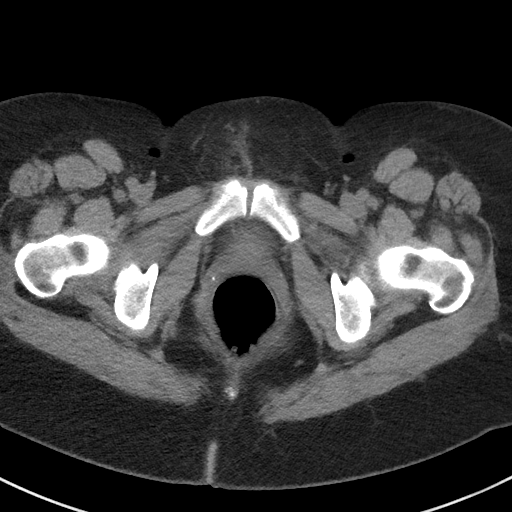
[im 19/93  soft-tissue]
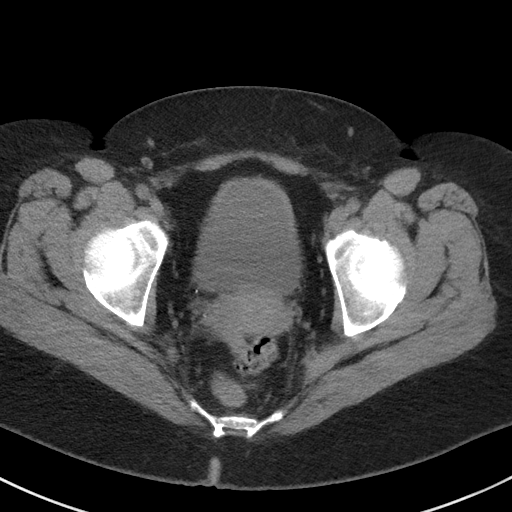
[im 26/93  soft-tissue]
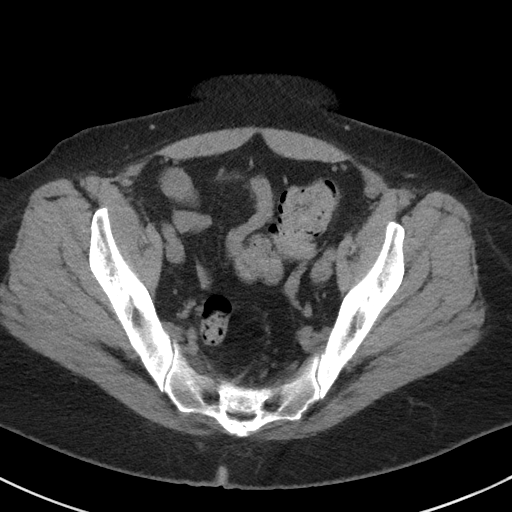
[im 34/93  soft-tissue]
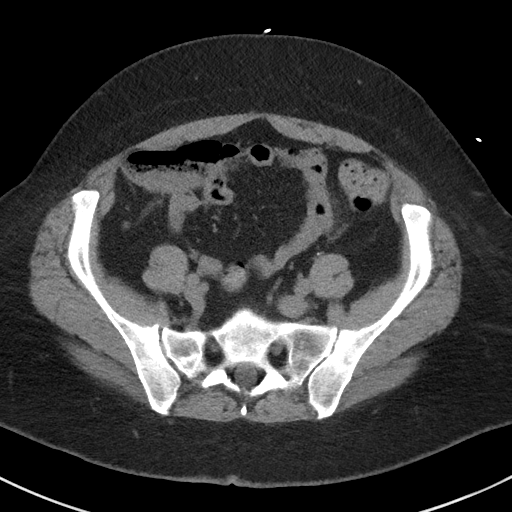
[im 41/93  soft-tissue]
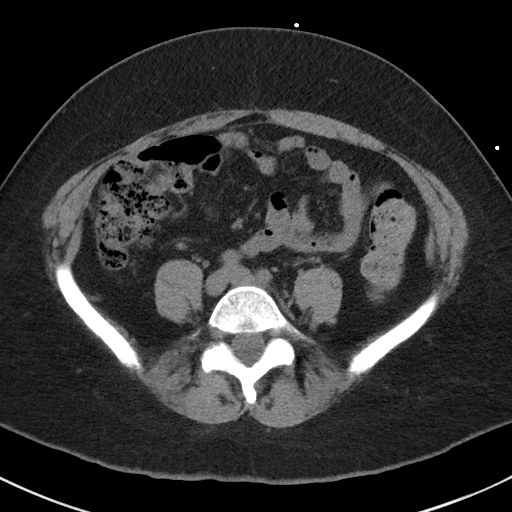
[im 48/93  soft-tissue]
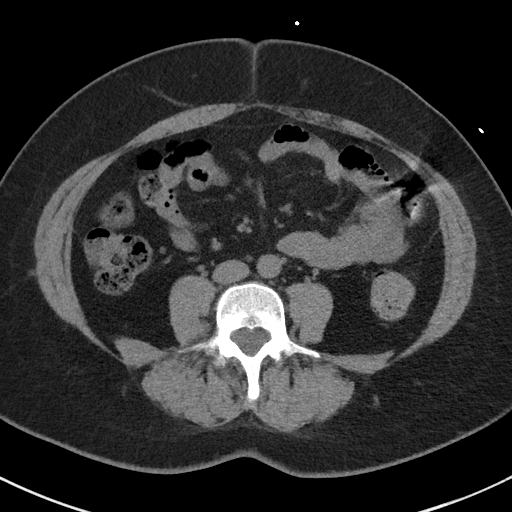
[im 52/93  soft-tissue]
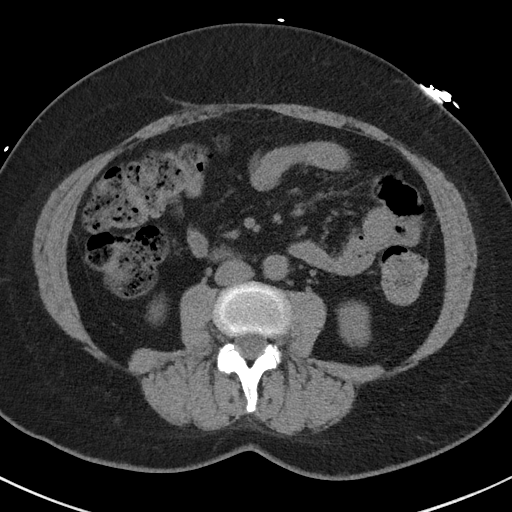
[im 59/93  soft-tissue]
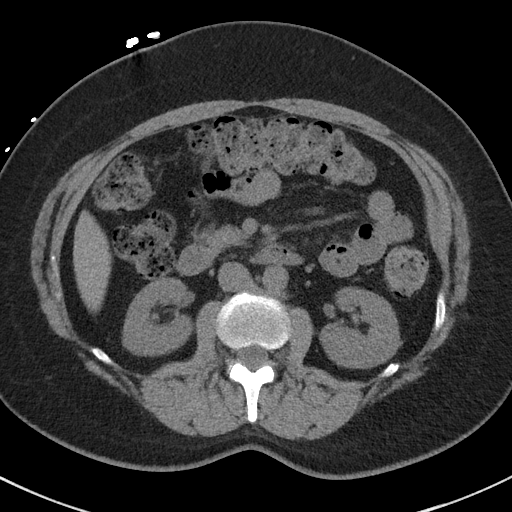
[im 59/93  bone]
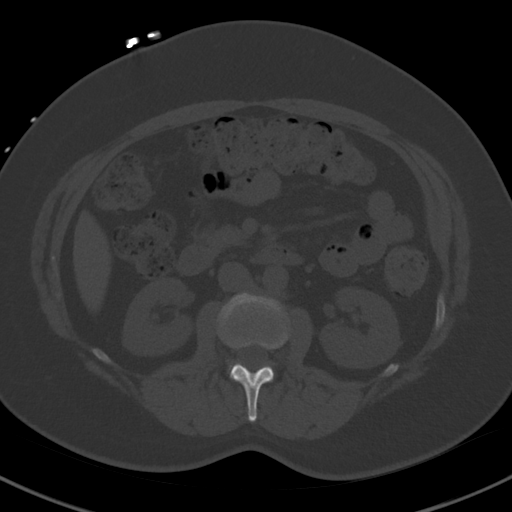
[im 67/93  soft-tissue]
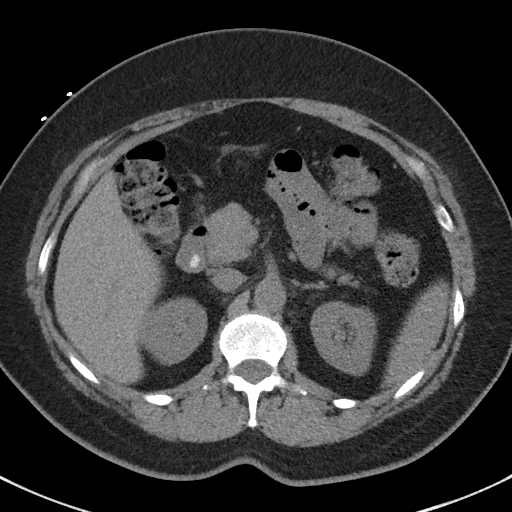
[im 74/93  soft-tissue]
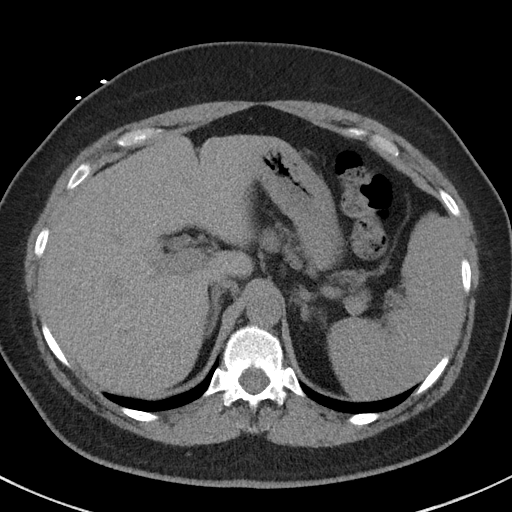
[im 81/93  soft-tissue]
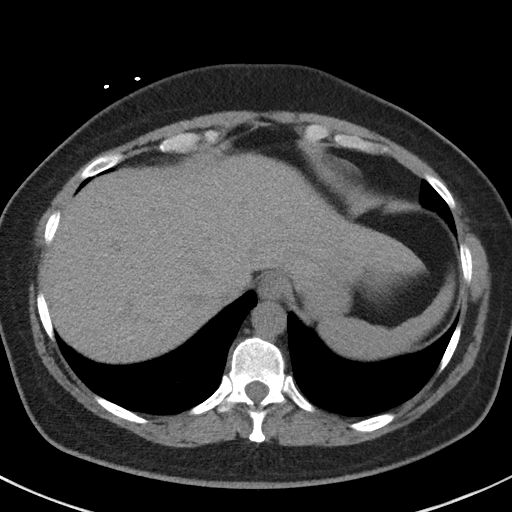
[im 89/93  soft-tissue]
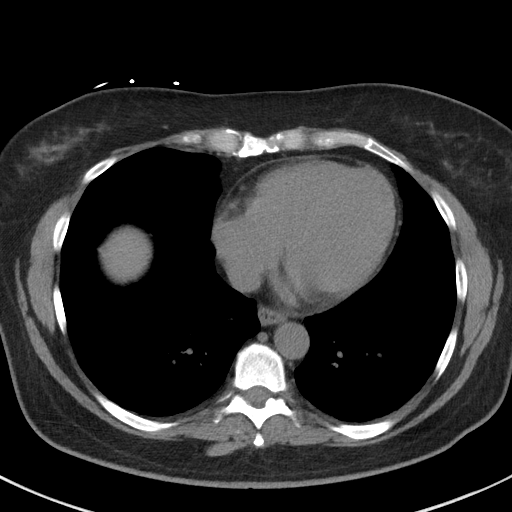

[Series 6: coronal · coronal · 0.69mm/px · 3 of 152 slices shown]
[im 51/152  soft-tissue]
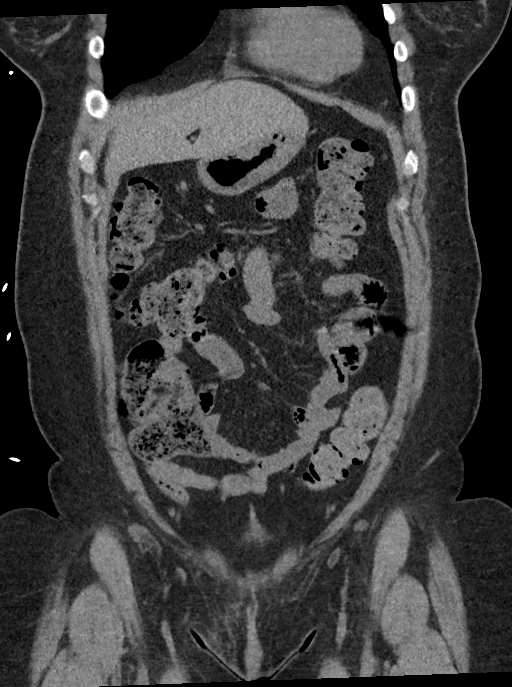
[im 68/152  soft-tissue]
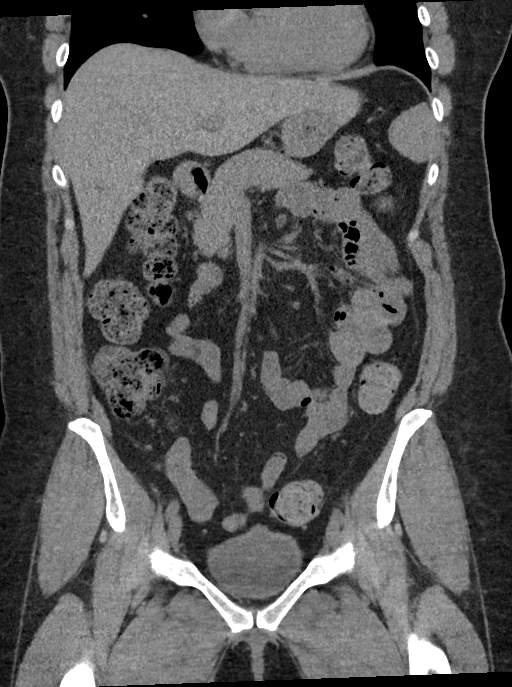
[im 84/152  soft-tissue]
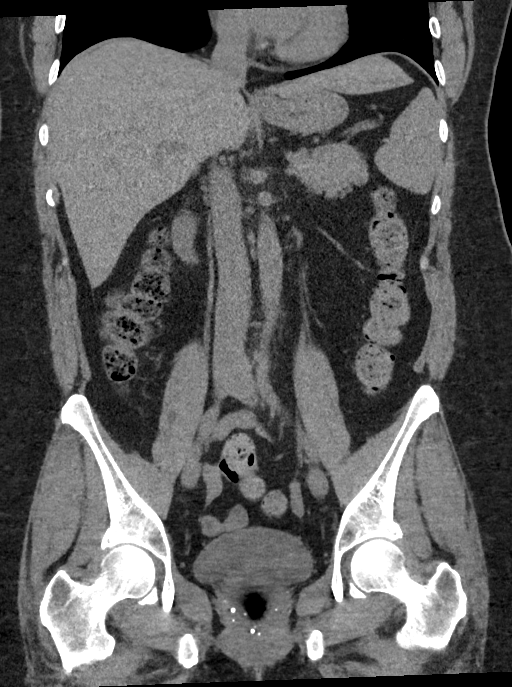

[16 of 46 positions shown; findings below may reference images not displayed]

FINDINGS: Lower chest: No acute abnormality.

Hepatobiliary: No focal liver abnormality is seen. Status post
cholecystectomy. No biliary dilatation.

Pancreas: Unremarkable. No pancreatic ductal dilatation or
surrounding inflammatory changes.

Spleen: Normal in size without focal abnormality.

Adrenals/Urinary Tract: Adrenal glands are unremarkable. Kidneys are
normal, without renal calculi, focal lesion, or hydronephrosis.
Bladder is unremarkable.

Stomach/Bowel: Stomach is within normal limits. Appendix appears
normal. No evidence of bowel wall thickening, distention, or
inflammatory changes.

Vascular/Lymphatic: No significant vascular findings are present. No
enlarged abdominal or pelvic lymph nodes.

Reproductive: Status post hysterectomy. No adnexal masses.

Other: No abdominal wall hernia or abnormality. No abdominopelvic
ascites.

Musculoskeletal: No acute or significant osseous findings.
IMPRESSION: No abnormality seen in the abdomen or pelvis.

## 2018-04-25 ENCOUNTER — Other Ambulatory Visit: Payer: Self-pay | Admitting: Internal Medicine

## 2018-05-21 ENCOUNTER — Other Ambulatory Visit: Payer: Self-pay | Admitting: Internal Medicine

## 2018-05-29 ENCOUNTER — Other Ambulatory Visit: Payer: Self-pay | Admitting: Internal Medicine

## 2018-05-29 NOTE — Telephone Encounter (Signed)
Will refill. Have her schedule annual exam.

## 2018-05-29 NOTE — Telephone Encounter (Signed)
Last filled 11/25/17 with 1 refill... last OV was acute, last CPE 06/02/17... please advise

## 2018-07-22 ENCOUNTER — Telehealth: Payer: Self-pay | Admitting: Internal Medicine

## 2018-07-22 NOTE — Telephone Encounter (Signed)
Left message asking pt to call office please r/s 3/30 appointment with regina baity.  PLEASE confirm patient insurance.  It looks like she has [facility] if so we are out of network

## 2018-07-26 ENCOUNTER — Other Ambulatory Visit: Payer: Self-pay | Admitting: Internal Medicine

## 2018-08-03 ENCOUNTER — Encounter: Payer: BLUE CROSS/BLUE SHIELD | Admitting: Internal Medicine

## 2018-08-06 ENCOUNTER — Other Ambulatory Visit: Payer: Self-pay | Admitting: Internal Medicine

## 2018-08-07 NOTE — Telephone Encounter (Signed)
Need to schedule web visit

## 2018-08-26 ENCOUNTER — Other Ambulatory Visit: Payer: Self-pay | Admitting: Internal Medicine

## 2018-09-04 ENCOUNTER — Encounter: Payer: BLUE CROSS/BLUE SHIELD | Admitting: Internal Medicine

## 2018-10-26 ENCOUNTER — Encounter: Payer: Self-pay | Admitting: Internal Medicine

## 2018-10-26 ENCOUNTER — Ambulatory Visit: Payer: BLUE CROSS/BLUE SHIELD | Admitting: Internal Medicine

## 2018-10-26 ENCOUNTER — Other Ambulatory Visit: Payer: Self-pay

## 2018-10-26 VITALS — BP 120/84 | HR 72 | Temp 98.2°F | Ht 64.5 in | Wt 222.0 lb

## 2018-10-26 DIAGNOSIS — Z8619 Personal history of other infectious and parasitic diseases: Secondary | ICD-10-CM

## 2018-10-26 DIAGNOSIS — K219 Gastro-esophageal reflux disease without esophagitis: Secondary | ICD-10-CM | POA: Diagnosis not present

## 2018-10-26 DIAGNOSIS — F3177 Bipolar disorder, in partial remission, most recent episode mixed: Secondary | ICD-10-CM

## 2018-10-26 DIAGNOSIS — E038 Other specified hypothyroidism: Secondary | ICD-10-CM

## 2018-10-26 DIAGNOSIS — R232 Flushing: Secondary | ICD-10-CM | POA: Diagnosis not present

## 2018-10-26 DIAGNOSIS — Z0000 Encounter for general adult medical examination without abnormal findings: Secondary | ICD-10-CM | POA: Diagnosis not present

## 2018-10-26 DIAGNOSIS — R635 Abnormal weight gain: Secondary | ICD-10-CM | POA: Diagnosis not present

## 2018-10-26 DIAGNOSIS — E559 Vitamin D deficiency, unspecified: Secondary | ICD-10-CM | POA: Diagnosis not present

## 2018-10-26 DIAGNOSIS — G43019 Migraine without aura, intractable, without status migrainosus: Secondary | ICD-10-CM

## 2018-10-26 DIAGNOSIS — D649 Anemia, unspecified: Secondary | ICD-10-CM

## 2018-10-26 DIAGNOSIS — F4310 Post-traumatic stress disorder, unspecified: Secondary | ICD-10-CM

## 2018-10-26 DIAGNOSIS — F5104 Psychophysiologic insomnia: Secondary | ICD-10-CM

## 2018-10-26 LAB — LIPID PANEL
Cholesterol: 188 mg/dL (ref 0–200)
HDL: 42.6 mg/dL (ref >39.00)
LDL Cholesterol: 120 mg/dL — ABNORMAL HIGH (ref 0–99)
NonHDL: 145.82
Total CHOL/HDL Ratio: 4
Triglycerides: 130 mg/dL (ref 0.0–149.0)
VLDL: 26 mg/dL (ref 0.0–40.0)

## 2018-10-26 LAB — T4, FREE: Free T4: 1.05 ng/dL (ref 0.60–1.60)

## 2018-10-26 LAB — COMPREHENSIVE METABOLIC PANEL
ALT: 12 U/L (ref 0–35)
AST: 10 U/L (ref 0–37)
Albumin: 4.2 g/dL (ref 3.5–5.2)
Alkaline Phosphatase: 89 U/L (ref 39–117)
BUN: 17 mg/dL (ref 6–23)
CO2: 28 mEq/L (ref 19–32)
Calcium: 9 mg/dL (ref 8.4–10.5)
Chloride: 104 mEq/L (ref 96–112)
Creatinine, Ser: 1 mg/dL (ref 0.40–1.20)
GFR: 58.82 mL/min — ABNORMAL LOW (ref >60.00)
Glucose, Bld: 94 mg/dL (ref 70–99)
Potassium: 4.4 mEq/L (ref 3.5–5.1)
Sodium: 139 mEq/L (ref 135–145)
Total Bilirubin: 0.4 mg/dL (ref 0.2–1.2)
Total Protein: 6.6 g/dL (ref 6.0–8.3)

## 2018-10-26 LAB — CBC
HCT: 34.1 % — ABNORMAL LOW (ref 36.0–46.0)
Hemoglobin: 10.7 g/dL — ABNORMAL LOW (ref 12.0–15.0)
MCHC: 31.5 g/dL (ref 30.0–36.0)
MCV: 73.3 fl — ABNORMAL LOW (ref 78.0–100.0)
Platelets: 340 10*3/uL (ref 150.0–400.0)
RBC: 4.65 Mil/uL (ref 3.87–5.11)
RDW: 20.9 % — ABNORMAL HIGH (ref 11.5–15.5)
WBC: 7.2 10*3/uL (ref 4.0–10.5)

## 2018-10-26 LAB — FOLLICLE STIMULATING HORMONE: FSH: 95.4 m[IU]/mL

## 2018-10-26 LAB — LUTEINIZING HORMONE: LH: 45.18 m[IU]/mL

## 2018-10-26 LAB — TSH: TSH: 3.48 u[IU]/mL (ref 0.35–4.50)

## 2018-10-26 LAB — VITAMIN D 25 HYDROXY (VIT D DEFICIENCY, FRACTURES): VITD: 20.41 ng/mL — ABNORMAL LOW (ref 30.00–100.00)

## 2018-10-26 MED ORDER — ACYCLOVIR 5 % EX OINT: 1.0000 "application " | TOPICAL_OINTMENT | CUTANEOUS | 3 refills | Status: AC | PRN

## 2018-10-26 MED ORDER — PHENTERMINE HCL 15 MG PO CAPS: 15.0000 mg | ORAL_CAPSULE | ORAL | 0 refills | Status: DC

## 2018-10-26 MED ORDER — HYDROCORTISONE-ACETIC ACID 1-2 % OT SOLN: 2.0000 [drp] | Freq: Two times a day (BID) | OTIC | 0 refills | Status: AC | PRN

## 2018-10-26 NOTE — Assessment & Plan Note (Signed)
Controlled with Imitrex and Advil migraine as needed. Will monitor.

## 2018-10-26 NOTE — Assessment & Plan Note (Signed)
Continue Trazodone for sleep.

## 2018-10-26 NOTE — Assessment & Plan Note (Addendum)
Support offered today. Continue on Lamictal, Effexor, and Clonazepam as prescribed for now. Risk of serotonin syndrome discussed with patient regarding taking Effexor and Phentermine. Will continue to follow with psych.

## 2018-10-26 NOTE — Progress Notes (Addendum)
Subjective:    Patient ID: Sandra Bass, female    DOB: April 26, 1971, 48 y.o.   MRN: [id number]  HPI  Patient presents today for her annual physical examination.  She is also due for a follow up on her chronic conditions.  GERD:  She is unsure what triggers this.  She takes Pantoprazole as prescribed and denies any breakthrough symptoms.  Upper GI from 04/2005 reviewed.  Migraines:  These occur about 1-2 per month.  She takes Imitrex and Advil migraine as needed with good relief.  Hypothyroidism:  She denies any issues on her current dose of Levothyroxine.  Her last TSH was stable 07/2017.  There is no documented history of thyroid pathology or ultrasound.  Anxiety/Bipolar disorder/Depression:  Secondary to PTSD.  She is taking Lamictal and Effexor as prescribed.  She has Clonazepam but doesn't take it.  She follows with Dr[facility]ul[facility] Con[facility] at [facility].  Insomnia:  She has trouble falling asleep.  She takes Trazodone at night.  She has no sleep study in chart.  History of cold sores:  She treats with Acyclovir ointment as needed.  Flu: 02/2018 Tetanus: 03/2015 Pap Smear: 05/2017, partial hysterectomy with cervix Mammogram: 01/2018 Colon screening:05/2016, needs every 3 years. Vision screening: annually Dentist: as needed  Diet: She does eat meat. She consumes some fruits and veggies. She does not eat a lot fried foods. She drinks mostly water and sweet tea. Exercise: None Review of Systems  Past Medical History:  Diagnosis Date  . Anal fissure   . Anxiety   . Bipolar disorder ([place])   . Depression    BIPOLAR  . GERD (gastroesophageal reflux disease)   . History of chicken pox   . Hypothyroidism   . Migraine   . UTI (lower urinary tract infection)     Current Outpatient Medications  Medication Sig Dispense Refill  . acetic acid-hydrocortisone (VOSOL-HC) OTIC solution Place 2 drops into the left ear 2 (two) times daily as needed. 10 mL 0  . acyclovir ointment (ZOVIRAX) 5 % Apply 1  application topically as needed.    . Ascorbic Acid (VITAMIN C GUMMIE PO) Take 1 tablet by mouth daily.    . Cyanocobalamin (VITAMIN B-12 PO) Take 1 tablet by mouth daily.    Marland Kitchen FIBER PO Take 2 capsules by mouth daily.     . LamoTRIgine 100 MG TB24 24 hour tablet TAKE 2 TABLETS BY MOUTH EVERY AM AND 1 TABLET AT BEDTIME  1  . levothyroxine (SYNTHROID, LEVOTHROID) 125 MCG tablet TAKE 1 TABLET (125 MCG TOTAL) BY MOUTH DAILY. MUST SCHEDULE ANNUAL EXAM 90 tablet 0  . nitrofurantoin (MACRODANTIN) 100 MG capsule TAKE 1 CAPSULE (100 MG TOTAL) BY MOUTH DAILY AS NEEDED AFTER INTERCOURSE 30 capsule 3  . ondansetron (ZOFRAN-ODT) 8 MG disintegrating tablet TAKE 1 TABLET (8 MG TOTAL) BY MOUTH EVERY 8 (EIGHT) HOURS AS NEEDED FOR NAUSEA OR VOMITING. 20 tablet 1  . pantoprazole (PROTONIX) 40 MG tablet TAKE 1 TABLET (40 MG TOTAL) BY MOUTH DAILY. MUST SCHEDULE ANNUAL EXAM 90 tablet 0  . Probiotic Product (PROBIOTIC DAILY PO) Take 1 tablet by mouth daily. At breakfast    . SUMAtriptan (IMITREX) 100 MG tablet Take 1 tablet every 2 hours as needed for headache SCHEDULE PHYSICAL EXAM 10 tablet 0  . traZODone (DESYREL) 100 MG tablet Take 1.5 tablets by mouth at bedtime.  1  . venlafaxine XR (EFFEXOR-XR) 75 MG 24 hr capsule Take 1 capsule by mouth every morning.  1  No current facility-administered medications for this visit.     Allergies  Allergen Reactions  . Aspirin Other (See Comments)    Nose bleeds  . Hydrocodone Itching    Clawing of the skin    Family History  Problem Relation Age of Onset  . Arthritis Mother   . Glaucoma Father   . Stroke Maternal Grandmother   . Stroke Maternal Grandfather   . Tongue cancer Paternal Grandmother   . Alzheimer's disease Paternal Grandmother   . Heart disease Paternal Grandfather   . Glaucoma Paternal Grandfather   . Lung cancer Paternal Grandfather   . Brain cancer Paternal Grandfather     Social History   Socioeconomic History  . Marital status: Married     Spouse name: Not on file  . Number of children: 0  . Years of education: Not on file  . Highest education level: Not on file  Occupational History  . Occupation: bookkeeper  Scientific laboratory technician  . Financial resource strain: Not on file  . Food insecurity    Worry: Not on file    Inability: Not on file  . Transportation needs    Medical: Not on file    Non-medical: Not on file  Tobacco Use  . Smoking status: Never Smoker  . Smokeless tobacco: Never Used  Substance and Sexual Activity  . Alcohol use: No    Alcohol/week: 0.0 standard drinks  . Drug use: No  . Sexual activity: Yes    Partners: Male    Birth control/protection: Surgical    Comment: Husband  Lifestyle  . Physical activity    Days per week: Not on file    Minutes per session: Not on file  . Stress: Not on file  Relationships  . Social Herbalist on phone: Not on file    Gets together: Not on file    Attends religious service: Not on file    Active member of club or organization: Not on file    Attends meetings of clubs or organizations: Not on file    Relationship status: Not on file  . Intimate partner violence    Fear of current or ex partner: Not on file    Emotionally abused: Not on file    Physically abused: Not on file    Forced sexual activity: Not on file  Other Topics Concern  . Not on file  Social History Narrative   Works at [employer], [place] in [place]   Lives with husband    Children- none   Pets: None    Caffeine- None, rare hot chocolate    Right handed    Enjoys baseball, taking an art class     Constitutional: Pt reports weight gain.Denies fever, malaise, fatigue, headache or abrupt weight changes.  HEENT: Pt reports itchy ears, bloody nose. Denies eye pain, eye redness, ear pain, ringing in the ears, wax buildup, runny nose, nasal congestion, or sore throat. Respiratory: Denies difficulty breathing, shortness of breath, cough or sputum production.   Cardiovascular: Denies chest  pain, chest tightness, palpitations or swelling in the hands or feet.  Gastrointestinal: Pt reports constipation. Denies abdominal pain, bloating,  diarrhea or blood in the stool.  GU: Denies urgency, frequency, pain with urination, burning sensation, blood in urine, odor or discharge. Musculoskeletal: Denies decrease in range of motion, difficulty with gait, muscle pain or joint pain and swelling.  Skin: Denies redness, rashes, lesions or ulcercations.  Neurological: Denies dizziness, difficulty with memory, difficulty  with speech or problems with balance and coordination.  Psych: Pt has a history of depression. Denies anxiety, SI/HI.  No other specific complaints in a complete review of systems (except as listed in HPI above).     Objective:   Physical Exam BP 120/84   Pulse 72   Temp 98.2 F (36.8 C) (Oral)   Ht 5' 4.5" (1.638 m)   Wt 222 lb (100.7 kg)   LMP 05/06/2012   BMI 37.52 kg/m   Wt Readings from Last 3 Encounters:  12/22/17 210 lb (95.3 kg)  07/30/17 205 lb (93 kg)  06/02/17 201 lb 6.4 oz (91.4 kg)    General: Appears her stated age, obese, in NAD. Skin: Warm, dry and intact. No rashes, lesions or ulcerations noted. HEENT: Head: normal shape and size; Eyes: sclera white, no icterus, conjunctiva pink, PERRLA and EOMs intact; Ears: Tm's gray and intact, normal light reflex, dry patches noted in external ear canal;  Neck:  Neck supple, trachea midline. No masses, lumpspresent.  Cardiovascular: Normal rate and rhythm. S1,S2 noted.  No murmur, rubs or gallops noted. No JVD or BLE edema. No carotid bruits noted. Pulmonary/Chest: Normal effort and positive vesicular breath sounds. No respiratory distress. No wheezes, rales or ronchi noted.  Abdomen: Soft and nontender. Normal bowel sounds. No distention or masses noted. Liver, spleen and kidneys non palpable. Musculoskeletal: Strength 5/5 BUE/BLE. No difficulty with gait.  Neurological: Alert and oriented. Cranial nerves  II-XII grossly intact. Coordination normal.  Psychiatric: Mood and affect normal. Behavior is normal. Judgment and thought content normal.     BMET    Component Value Date/Time   NA 140 12/22/2017 1215   NA 140 03/18/2014   NA 140 07/24/2012 0541   K 4.0 12/22/2017 1215   K 3.9 07/24/2012 0541   CL 103 12/22/2017 1215   CL 109 (H) 07/24/2012 0541   CO2 30 12/22/2017 1215   CO2 25 07/24/2012 0541   GLUCOSE 89 12/22/2017 1215   GLUCOSE 121 (H) 07/24/2012 0541   BUN 20 12/22/2017 1215   BUN 14 03/18/2014   BUN 10 07/24/2012 0541   CREATININE 1.20 12/22/2017 1215   CREATININE 1.13 07/24/2012 0541   CALCIUM 9.7 12/22/2017 1215   CALCIUM 8.0 (L) 07/24/2012 0541   GFRNONAA >60 07/30/2017 1034   GFRNONAA 59 (L) 07/24/2012 0541   GFRAA >60 07/30/2017 1034   GFRAA >60 07/24/2012 0541    Lipid Panel     Component Value Date/Time   CHOL 189 06/02/2017 0907   TRIG 60.0 06/02/2017 0907   HDL 43.00 06/02/2017 0907   CHOLHDL 4 06/02/2017 0907   VLDL 12.0 06/02/2017 0907   LDLCALC 134 (H) 06/02/2017 0907    CBC    Component Value Date/Time   WBC 8.7 12/22/2017 1215   RBC 5.40 (H) 12/22/2017 1215   HGB 12.4 12/22/2017 1215   HGB 11.4 (L) 07/24/2012 0541   HCT 39.7 12/22/2017 1215   HCT 34.1 (L) 07/24/2012 0541   PLT 298.0 12/22/2017 1215   PLT 228 07/24/2012 0541   MCV 73.6 (L) 12/22/2017 1215   MCV 79 (L) 07/24/2012 0541   MCH 27.0 07/30/2017 1034   MCHC 31.2 12/22/2017 1215   RDW 17.1 (H) 12/22/2017 1215   RDW 16.7 (H) 07/24/2012 0541   LYMPHSABS 2.1 07/30/2017 1034   MONOABS 0.4 07/30/2017 1034   EOSABS 0.1 07/30/2017 1034   BASOSABS 0.1 07/30/2017 1034    Hgb A1C Lab Results  Component Value Date  HGBA1C 5.3 04/17/2015             Assessment & Plan:   Preventative Health Maintenance:  Encouraged patient to get flu shot in the fall. Tetanus UTD. Pap smear UTD. Advised her to schedule her mammogram in September 2020. Continue to consume a  healthy diet and get routine exercise in at least 3 times per week. Continue to see eye doctor annually and dentist as needed. Will check CBC, CMet, TSH, Free T4, Lipids, Vitamin D, FSH/LH today.  Abnormal weight gain:    Discussed importance of a low carb diet.   Encouraged 150 minutes of exercise per week.   Will start Phentermine 15 mg po daily.   Discussed risk of serotonin syndrome while taking it with Effexor.    Follow up in 1 month for weight check and med refill.    Webb Silversmith, NP

## 2018-10-26 NOTE — Assessment & Plan Note (Signed)
Support offered today. Continue on Lamictal, Effexor, and Clonazepam as prescribed for now. Risk of serotonin syndrome discussed with patient regarding taking Effexor and Phentermine. Will continue to follow with psych.

## 2018-10-26 NOTE — Assessment & Plan Note (Signed)
CBC and Cmet today. Continue Pantoprazole for now.

## 2018-10-26 NOTE — Assessment & Plan Note (Signed)
TSH, Free T4 today Continue Levothyroxine as prescribed for now.

## 2018-10-26 NOTE — Patient Instructions (Signed)

## 2018-10-26 NOTE — Assessment & Plan Note (Signed)
Zovirax ointment refilled today.

## 2018-10-27 DIAGNOSIS — Z0000 Encounter for general adult medical examination without abnormal findings: Secondary | ICD-10-CM | POA: Insufficient documentation

## 2018-10-27 MED ORDER — VITAMIN D (ERGOCALCIFEROL) 1.25 MG (50000 UNIT) PO CAPS: 50000.0000 [IU] | ORAL_CAPSULE | ORAL | 0 refills | Status: AC

## 2018-10-27 NOTE — Addendum Note (Signed)
Addended by: Jearld Fenton on: 10/27/2018 10:17 AM   Modules accepted: Orders

## 2018-10-29 ENCOUNTER — Encounter: Payer: Self-pay | Admitting: Internal Medicine

## 2018-11-02 ENCOUNTER — Other Ambulatory Visit: Payer: Self-pay | Admitting: Internal Medicine

## 2018-11-09 ENCOUNTER — Other Ambulatory Visit: Payer: Self-pay | Admitting: Internal Medicine

## 2018-11-09 NOTE — Telephone Encounter (Signed)
Last filled 05/29/2018.Marland KitchenMarland KitchenMarland Kitchen please advise

## 2018-11-12 ENCOUNTER — Other Ambulatory Visit: Payer: Self-pay | Admitting: Internal Medicine

## 2018-11-13 ENCOUNTER — Encounter: Payer: Self-pay | Admitting: Internal Medicine

## 2018-11-13 ENCOUNTER — Other Ambulatory Visit: Payer: Self-pay | Admitting: Internal Medicine

## 2018-11-13 MED ORDER — LEVOTHYROXINE SODIUM 125 MCG PO TABS: 125.0000 ug | ORAL_TABLET | Freq: Every day | ORAL | 1 refills | Status: DC

## 2018-12-01 ENCOUNTER — Telehealth: Payer: Self-pay

## 2018-12-01 DIAGNOSIS — Z20822 Contact with and (suspected) exposure to covid-19: Secondary | ICD-10-CM

## 2018-12-01 NOTE — Telephone Encounter (Signed)
Husband called to find out location of covid testing. Pt informed of site at [facility] in [place]. Order placed. Pt advised to wear mask and to stay in car for testing.

## 2018-12-28 ENCOUNTER — Encounter: Payer: Self-pay | Admitting: Internal Medicine

## 2019-01-04 ENCOUNTER — Encounter: Payer: Self-pay | Admitting: Physician Assistant

## 2019-01-04 ENCOUNTER — Telehealth: Payer: BLUE CROSS/BLUE SHIELD | Admitting: Physician Assistant

## 2019-01-04 DIAGNOSIS — R6883 Chills (without fever): Secondary | ICD-10-CM

## 2019-01-04 DIAGNOSIS — R42 Dizziness and giddiness: Secondary | ICD-10-CM

## 2019-01-04 NOTE — Progress Notes (Signed)
Based on what you shared with me, I feel your condition warrants further evaluation and I recommend that you be seen for a face to face office visit.  NOTE: If you entered your credit card information for this eVisit, you will not be charged. You may see a "hold" on your card for the $35 but that hold will drop off and you will not have a charge processed.  Hello Ms. Vanderford,  The lightheadedness is not a common symptoms of the flu. I recommend having a face to face evaluation for further workup of your symptoms. Vitals and other physical examination is needed to ensure the right workup is completed for your symptoms.     If you are having a true medical emergency please call 911.     For an urgent face to face visit, [place] has four urgent care centers for your convenience:   . [facility]    [phone]                  Get Driving Directions  [id number] [place], [place] [address] . 10 am to 8 pm Monday-Friday . 12 pm to 8 pm Saturday-Sunday   . [facility] [place]                  Get Driving Directions  [id number] [place], [place], [place] [address] . 8 am to 8 pm Monday-Friday . 9 am to 6 pm Saturday . 11 am to 6 pm Sunday   . [facility] at [place]                  Get Driving Directions   [address] [place], [place] [address] . 8 am to 8 pm Monday-Friday . 8 am to 4 pm Saturday-Sunday    . [facility] at [place]                    Get Driving Directions  [id number]  [address], [place], [place] [address]  . Monday-Friday, 12 PM to 6 PM    Your e-visit answers were reviewed by a board certified advanced clinical practitioner to complete your personal care plan.  Thank you for using e-Visits. I spent 5-10 minutes on review and completion of this note- Lacy Duverney [facility]

## 2019-01-13 ENCOUNTER — Other Ambulatory Visit: Payer: Self-pay | Admitting: Internal Medicine

## 2019-02-12 DIAGNOSIS — D509 Iron deficiency anemia, unspecified: Secondary | ICD-10-CM | POA: Insufficient documentation

## 2019-02-12 DIAGNOSIS — E559 Vitamin D deficiency, unspecified: Secondary | ICD-10-CM | POA: Insufficient documentation

## 2019-03-05 ENCOUNTER — Other Ambulatory Visit: Payer: Self-pay | Admitting: Internal Medicine

## 2019-05-07 ENCOUNTER — Other Ambulatory Visit: Payer: Self-pay | Admitting: Internal Medicine

## 2019-05-27 ENCOUNTER — Other Ambulatory Visit: Payer: Self-pay | Admitting: Internal Medicine

## 2019-05-28 NOTE — Telephone Encounter (Signed)
Last filled 11/09/2018 with no refills... CPE 10/2018... please advise

## 2019-08-04 ENCOUNTER — Other Ambulatory Visit: Payer: Self-pay | Admitting: Internal Medicine

## 2019-09-04 ENCOUNTER — Other Ambulatory Visit: Payer: Self-pay | Admitting: Internal Medicine

## 2019-10-08 DIAGNOSIS — Z860100 Personal history of colon polyps, unspecified: Secondary | ICD-10-CM | POA: Insufficient documentation

## 2019-10-08 DIAGNOSIS — K582 Mixed irritable bowel syndrome: Secondary | ICD-10-CM | POA: Insufficient documentation

## 2019-10-08 DIAGNOSIS — J383 Other diseases of vocal cords: Secondary | ICD-10-CM | POA: Insufficient documentation

## 2019-10-08 DIAGNOSIS — E66812 Obesity, class 2: Secondary | ICD-10-CM | POA: Insufficient documentation

## 2019-10-08 DIAGNOSIS — Z6835 Body mass index (BMI) 35.0-35.9, adult: Secondary | ICD-10-CM | POA: Insufficient documentation

## 2020-03-10 DIAGNOSIS — K635 Polyp of colon: Secondary | ICD-10-CM | POA: Insufficient documentation

## 2020-10-20 DIAGNOSIS — R030 Elevated blood-pressure reading, without diagnosis of hypertension: Secondary | ICD-10-CM | POA: Insufficient documentation

## 2020-10-20 DIAGNOSIS — R232 Flushing: Secondary | ICD-10-CM | POA: Insufficient documentation

## 2020-10-20 DIAGNOSIS — G5603 Carpal tunnel syndrome, bilateral upper limbs: Secondary | ICD-10-CM | POA: Insufficient documentation

## 2020-10-20 DIAGNOSIS — L409 Psoriasis, unspecified: Secondary | ICD-10-CM | POA: Insufficient documentation

## 2020-10-22 DIAGNOSIS — E7800 Pure hypercholesterolemia, unspecified: Secondary | ICD-10-CM | POA: Insufficient documentation

## 2021-08-20 ENCOUNTER — Ambulatory Visit
Admission: EM | Admit: 2021-08-20 | Discharge: 2021-08-20 | Disposition: A | Discharge status: Home / Self Care | Payer: BLUE CROSS/BLUE SHIELD | Attending: Physician Assistant | Admitting: Physician Assistant

## 2021-08-20 DIAGNOSIS — K529 Noninfective gastroenteritis and colitis, unspecified: Secondary | ICD-10-CM | POA: Diagnosis present

## 2021-08-20 DIAGNOSIS — R112 Nausea with vomiting, unspecified: Secondary | ICD-10-CM | POA: Insufficient documentation

## 2021-08-20 DIAGNOSIS — R197 Diarrhea, unspecified: Secondary | ICD-10-CM | POA: Diagnosis present

## 2021-08-20 LAB — CBC WITH DIFFERENTIAL/PLATELET
Abs Immature Granulocytes: 0.02 K/uL (ref 0.00–0.07)
Basophils Absolute: 0 K/uL (ref 0.0–0.1)
Basophils Relative: 0 %
Eosinophils Absolute: 0 K/uL (ref 0.0–0.5)
Eosinophils Relative: 0 %
HCT: 45.4 % (ref 36.0–46.0)
Hemoglobin: 14.6 g/dL (ref 12.0–15.0)
Immature Granulocytes: 0 %
Lymphocytes Relative: 16 %
Lymphs Abs: 1.9 K/uL (ref 0.7–4.0)
MCH: 25.9 pg — ABNORMAL LOW (ref 26.0–34.0)
MCHC: 32.2 g/dL (ref 30.0–36.0)
MCV: 80.5 fL (ref 80.0–100.0)
Monocytes Absolute: 0.5 K/uL (ref 0.1–1.0)
Monocytes Relative: 5 %
Neutro Abs: 9.3 K/uL — ABNORMAL HIGH (ref 1.7–7.7)
Neutrophils Relative %: 79 %
Platelets: 335 K/uL (ref 150–400)
RBC: 5.64 MIL/uL — ABNORMAL HIGH (ref 3.87–5.11)
RDW: 16.1 % — ABNORMAL HIGH (ref 11.5–15.5)
WBC: 11.8 K/uL — ABNORMAL HIGH (ref 4.0–10.5)
nRBC: 0 % (ref 0.0–0.2)

## 2021-08-20 LAB — URINALYSIS, ROUTINE W REFLEX MICROSCOPIC
Bilirubin Urine: NEGATIVE
Glucose, UA: NEGATIVE mg/dL
Ketones, ur: 15 mg/dL — AB
Leukocytes,Ua: NEGATIVE
Nitrite: NEGATIVE
Protein, ur: NEGATIVE mg/dL
Specific Gravity, Urine: 1.02 (ref 1.005–1.030)
pH: 6.5 (ref 5.0–8.0)

## 2021-08-20 LAB — URINALYSIS, MICROSCOPIC (REFLEX)

## 2021-08-20 LAB — BASIC METABOLIC PANEL
Anion gap: 10 (ref 5–15)
BUN: 12 mg/dL (ref 6–20)
CO2: 26 mmol/L (ref 22–32)
Calcium: 9.8 mg/dL (ref 8.9–10.3)
Chloride: 100 mmol/L (ref 98–111)
Creatinine, Ser: 1.16 mg/dL — ABNORMAL HIGH (ref 0.44–1.00)
GFR, Estimated: 57 mL/min — ABNORMAL LOW (ref >60)
Glucose, Bld: 111 mg/dL — ABNORMAL HIGH (ref 70–99)
Potassium: 3.8 mmol/L (ref 3.5–5.1)
Sodium: 136 mmol/L (ref 135–145)

## 2021-08-20 LAB — LIPASE, BLOOD: Lipase: 29 U/L (ref 11–51)

## 2021-08-20 MED ORDER — PROMETHAZINE HCL 12.5 MG PO TABS: 12.5000 mg | ORAL_TABLET | Freq: Three times a day (TID) | ORAL | 0 refills | Status: AC | PRN

## 2021-08-20 MED ORDER — PROMETHAZINE HCL 25 MG/ML IJ SOLN
12.5000 mg | Freq: Once | INTRAMUSCULAR | Status: AC
  Administered 2021-08-20: 12.5 mg via INTRAMUSCULAR

## 2021-08-20 NOTE — ED Provider Notes (Signed)
?[facility] ? ? ? ?CSN: [id number] ?Arrival date & time: 08/20/21  1408 ? ? ?  ? ?History   ?Chief Complaint ?Chief Complaint  ?Patient presents with  ? Emesis  ? ? ?HPI ?Sandra Bass is a 51 y.o. female.  ? ?Patient presents today with a 2-day history of GI symptoms.  Reports nausea, vomiting, diarrhea.  Reports abdominal cramping with pain rated 7/8 on a 0-10 pain scale, described as intense cramping, no aggravating or alleviating factors identified.  She denies any urinary symptoms including frequency, urgency, dysuria.  She denies any fever, chest pain, shortness of breath.  She has tried Zofran 8 mg twice without improvement of symptoms.  She is having difficulty keeping a food and liquids down and has not had significant oral intake in over 12 hours.  She denies any suspicious food intake, known sick contacts, recent travel, medication changes, recent antibiotic use.  Reports symptoms began after she developed a migraine but have been persistent.  She has had cholecystectomy but denies additional abdominal surgeries.  Denies history of gastrointestinal disorder.  She denies history of pancreatitis.  Denies any alcohol or GLP-1 agonist use. ? ? ?Past Medical History:  ?Diagnosis Date  ? Anal fissure   ? Anxiety   ? Bipolar disorder ([place])   ? Depression   ? BIPOLAR  ? GERD (gastroesophageal reflux disease)   ? History of chicken pox   ? Hypothyroidism   ? Migraine   ? UTI (lower urinary tract infection)   ? ? ?Patient Active Problem List  ? Diagnosis Date Noted  ? Hypercholesterolemia 10/22/2020  ? Psoriasis 10/20/2020  ? Bilateral carpal tunnel syndrome 10/20/2020  ? Elevated BP without diagnosis of hypertension 10/20/2020  ? Hot flashes 10/20/2020  ? Sigmoid polyp 03/10/2020  ? Class 2 severe obesity due to excess calories with serious comorbidity and body mass index (BMI) of 35.0 to 35.9 in adult [facility]) 10/08/2019  ? Hx of colonic polyp 10/08/2019  ? Irritable bowel syndrome with both constipation  and diarrhea 10/08/2019  ? Vocal cord dysfunction 10/08/2019  ? Iron deficiency anemia, unspecified 02/12/2019  ? Vitamin D deficiency 02/12/2019  ? Annual physical exam 10/27/2018  ? H/O cold sores 10/09/2015  ? Insomnia 10/09/2015  ? Hypothyroidism 10/09/2015  ? PTSD (post-traumatic stress disorder) 11/10/2014  ? Migraines 09/24/2014  ? GERD (gastroesophageal reflux disease) 09/23/2014  ? Bipolar disorder ([place]) 09/23/2014  ? ? ?Past Surgical History:  ?Procedure Laterality Date  ?  cyst Right 2011  ? Benign cyst removed R great toe  ? ABDOMINAL HYSTERECTOMY  2014  ? CHOLECYSTECTOMY  2010  ? TUBAL LIGATION  2007  ? ? ?OB History   ?No obstetric history on file. ?  ? ? ? ?Home Medications   ? ?Prior to Admission medications   ?Medication Sig Start Date End Date Taking? Authorizing Provider  ?acetic acid-hydrocortisone (VOSOL-HC) OTIC solution Place 2 drops into the left ear 2 (two) times daily as needed. 10/26/18  Yes Jearld Fenton, NP  ?albuterol (VENTOLIN HFA) 108 (90 Base) MCG/ACT inhaler Inhale into the lungs. 11/14/19  Yes [provider]  ?ascorbic acid (VITAMIN C) 250 MG CHEW Chew by mouth.   Yes [provider]  ?Cholecalciferol 100 MCG (4000 UT) TABS Take by mouth.   Yes [provider]  ?Cyanocobalamin 2000 MCG TBCR Take 1 tablet by mouth daily.   Yes [provider]  ?FIBER PO Take 2 capsules by mouth daily.    Yes  [provider]  ?LamoTRIgine 100 MG TB24 24 hour tablet TAKE 2 TABLETS BY MOUTH EVERY AM AND 1 TABLET AT BEDTIME 12/04/17  Yes [provider]  ?levothyroxine (SYNTHROID) 125 MCG tablet Take 1 tablet (125 mcg total) by mouth daily. 05/10/19  Yes Jearld Fenton, NP  ?methylphenidate 36 MG PO CR tablet Take 36 mg by mouth every morning. 07/27/21  Yes [provider]  ?metoprolol succinate (TOPROL-XL) 50 MG 24 hr tablet Take 50 mg by mouth daily. 08/14/21  Yes [provider]  ?nitrofurantoin (MACRODANTIN) 100 MG capsule TAKE ONE  CAPSULE BY MOUTH DAILY AS NEEDED AFTER INTERCOURSE 03/07/19  Yes Baity, Coralie Keens, NP  ?ondansetron (ZOFRAN-ODT) 8 MG disintegrating tablet TAKE 1 TABLET (8 MG TOTAL) BY MOUTH EVERY 8 (EIGHT) HOURS AS NEEDED FOR NAUSEA OR VOMITING. 09/08/17  Yes Jearld Fenton, NP  ?pantoprazole (PROTONIX) 40 MG tablet Take 1 tablet (40 mg total) by mouth daily. MUST SCHEDULE ANNUAL EXA 11/02/18  Yes Jearld Fenton, NP  ?Probiotic Product (PROBIOTIC DAILY PO) Take 1 tablet by mouth daily. At breakfast   Yes [provider]  ?promethazine (PHENERGAN) 12.5 MG tablet Take 1 tablet (12.5 mg total) by mouth every 8 (eight) hours as needed for nausea or vomiting. 08/20/21  Yes Anushree Dorsi, Derry Skill, PA-C  ?SUMAtriptan (IMITREX) 100 MG tablet TAKE 1 TABLET EVERY 2 HOURS AS NEEDED FOR HEADACHE 05/28/19  Yes Baity, Coralie Keens, NP  ?traZODone (DESYREL) 100 MG tablet Take 1.5 tablets by mouth at bedtime. 05/24/17  Yes [provider]  ?Vitamin D, Ergocalciferol, (DRISDOL) 1.25 MG (50000 UT) CAPS capsule Take 1 capsule (50,000 Units total) by mouth every 7 (seven) days. 10/27/18  Yes Jearld Fenton, NP  ?acyclovir ointment (ZOVIRAX) 5 % Apply 1 application topically as needed. 10/26/18   Jearld Fenton, NP  ?venlafaxine XR (EFFEXOR-XR) 150 MG 24 hr capsule venlafaxine ER 150 mg capsule,extended release 24 hr ? TAKE 1 CAPSULE BY MOUTH EVERY DAY IN THE MORNING    [provider]  ?venlafaxine XR (EFFEXOR-XR) 75 MG 24 hr capsule venlafaxine ER 75 mg capsule,extended release 24 hr 05/24/17   [provider]  ? ? ?Family History ?Family History  ?Problem Relation Age of Onset  ? Arthritis Mother   ? Glaucoma Father   ? Stroke Maternal Grandmother   ? Stroke Maternal Grandfather   ? Tongue cancer Paternal Grandmother   ? Alzheimer's disease Paternal Grandmother   ? Heart disease Paternal Grandfather   ? Glaucoma Paternal Grandfather   ? Lung cancer Paternal Grandfather   ? Brain cancer Paternal Grandfather   ? ? ?Social  History ?Social History  ? ?Tobacco Use  ? Smoking status: Never  ? Smokeless tobacco: Never  ?Vaping Use  ? Vaping Use: Never used  ?Substance Use Topics  ? Alcohol use: No  ?  Alcohol/week: 0.0 standard drinks  ? Drug use: No  ? ? ? ?Allergies   ?Aspirin, Hydrocodone, Tape, and Latex ? ? ?Review of Systems ?Review of Systems  ?Constitutional:  Positive for activity change, chills and fatigue. Negative for appetite change and fever.  ?Respiratory:  Negative for cough and shortness of breath.   ?Cardiovascular:  Negative for chest pain.  ?Gastrointestinal:  Positive for abdominal pain, diarrhea, nausea and vomiting.  ?Genitourinary:  Negative for dysuria, frequency and urgency.  ?Neurological:  Negative for dizziness, light-headedness and headaches.  ? ? ?Physical Exam ?Triage Vital Signs ?ED Triage Vitals  ?Enc Vitals Group  ?  BP 08/20/21 1444 (!) 139/92  ?   Pulse Rate 08/20/21 1444 82  ?   Resp 08/20/21 1444 20  ?   Temp 08/20/21 1444 (!) 97.3 ?F (36.3 ?C)  ?   Temp Source 08/20/21 1444 Oral  ?   SpO2 08/20/21 1444 100 %  ?   Weight 08/20/21 1438 250 lb (113.4 kg)  ?   Height 08/20/21 1438 '5\' 5"'$  (1.651 m)  ?   Head Circumference --   ?   Peak Flow --   ?   Pain Score 08/20/21 1438 0  ?   Pain Loc --   ?   Pain Edu? --   ?   Excl. in [place]? --   ? ?No data found. ? ?Updated Vital Signs ?BP (!) 139/92 (BP Location: Left Arm)   Pulse 82   Temp (!) 97.3 ?F (36.3 ?C) (Oral)   Resp 20   Ht '5\' 5"'$  (1.651 m)   Wt 250 lb (113.4 kg)   LMP 05/06/2012   SpO2 100%   BMI 41.60 kg/m?  ? ?Visual Acuity ?Right Eye Distance:   ?Left Eye Distance:   ?Bilateral Distance:   ? ?Right Eye Near:   ?Left Eye Near:    ?Bilateral Near:    ? ?Physical Exam ?Vitals reviewed.  ?Constitutional:   ?   General: She is awake. She is not in acute distress. ?   Appearance: Normal appearance. She is well-developed. She is ill-appearing.  ?   Comments: Very pleasant female appears stated age in no acute distress obviously uncomfortable  ?HENT:   ?   Head: Normocephalic and atraumatic.  ?   Mouth/Throat:  ?   Mouth: Mucous membranes are moist.  ?Cardiovascular:  ?   Rate and Rhythm: Normal rate and regular rhythm.  ?   Heart sounds: Normal heart sounds, S1 no

## 2021-08-20 NOTE — Discharge Instructions (Signed)
I believe that you have a virus causing your symptoms.  Your blood work showed slightly elevated white blood cells consistent with an infection.  Your kidney function was slightly reduced likely because you are not drinking.  As we discussed, you should push fluids and eat a bland diet.  If you have recurrent nausea/vomiting or if your abdominal pain worsens in any way you need to go directly to the emergency room for further evaluation and management.  You can take promethazine up to 3 times a day to help with the symptoms.  Please follow-up with either our clinic or primary care later this week (assuming improvement of symptoms and you are able to eat and drink without recurrent nausea/vomiting) to recheck your labs.  If you have recurrent nausea/vomiting, abdominal pain, fever, weakness you need to go to the emergency room immediately as we discussed. ?

## 2021-08-20 NOTE — ED Triage Notes (Signed)
Patient is here for "Nausea/Vomiting" with Diarrhea. Started yesterday morning. Last emesis "12N". Diarrhea/loose stools "last mid morning". Now Abd pain "Cramping". No fever. Last void "1 pm".  ?

## 2021-08-22 LAB — URINE CULTURE: Culture: 20000 — AB
